# Patient Record
Sex: Male | Born: 1955 | Race: White | Hispanic: No | Marital: Single | Smoking: Current every day smoker
Health system: Southern US, Community
[De-identification: ages and names within clinical notes are randomized; demographics above are authoritative.]

## PROBLEM LIST (undated history)

## (undated) DIAGNOSIS — F419 Anxiety disorder, unspecified: Secondary | ICD-10-CM

## (undated) DIAGNOSIS — E8770 Fluid overload, unspecified: Secondary | ICD-10-CM

## (undated) DIAGNOSIS — I10 Essential (primary) hypertension: Secondary | ICD-10-CM

## (undated) DIAGNOSIS — I770 Arteriovenous fistula, acquired: Secondary | ICD-10-CM

## (undated) DIAGNOSIS — D689 Coagulation defect, unspecified: Secondary | ICD-10-CM

## (undated) DIAGNOSIS — L03114 Cellulitis of left upper limb: Secondary | ICD-10-CM

## (undated) DIAGNOSIS — M4640 Discitis, unspecified, site unspecified: Secondary | ICD-10-CM

## (undated) DIAGNOSIS — L299 Pruritus, unspecified: Secondary | ICD-10-CM

## (undated) DIAGNOSIS — N186 End stage renal disease: Secondary | ICD-10-CM

## (undated) DIAGNOSIS — D509 Iron deficiency anemia, unspecified: Secondary | ICD-10-CM

## (undated) DIAGNOSIS — K602 Anal fissure, unspecified: Secondary | ICD-10-CM

## (undated) DIAGNOSIS — I499 Cardiac arrhythmia, unspecified: Secondary | ICD-10-CM

## (undated) DIAGNOSIS — G4730 Sleep apnea, unspecified: Secondary | ICD-10-CM

## (undated) DIAGNOSIS — K219 Gastro-esophageal reflux disease without esophagitis: Secondary | ICD-10-CM

## (undated) DIAGNOSIS — D631 Anemia in chronic kidney disease: Secondary | ICD-10-CM

## (undated) DIAGNOSIS — I129 Hypertensive chronic kidney disease with stage 1 through stage 4 chronic kidney disease, or unspecified chronic kidney disease: Secondary | ICD-10-CM

## (undated) DIAGNOSIS — Q613 Polycystic kidney, unspecified: Secondary | ICD-10-CM

## (undated) DIAGNOSIS — E875 Hyperkalemia: Secondary | ICD-10-CM

## (undated) DIAGNOSIS — G10 Huntington's disease: Secondary | ICD-10-CM

## (undated) DIAGNOSIS — T82590D Other mechanical complication of surgically created arteriovenous fistula, subsequent encounter: Secondary | ICD-10-CM

## (undated) DIAGNOSIS — E559 Vitamin D deficiency, unspecified: Secondary | ICD-10-CM

## (undated) DIAGNOSIS — E876 Hypokalemia: Secondary | ICD-10-CM

## (undated) DIAGNOSIS — R52 Pain, unspecified: Secondary | ICD-10-CM

## (undated) DIAGNOSIS — F329 Major depressive disorder, single episode, unspecified: Secondary | ICD-10-CM

## (undated) DIAGNOSIS — N2581 Secondary hyperparathyroidism of renal origin: Secondary | ICD-10-CM

## (undated) DIAGNOSIS — I639 Cerebral infarction, unspecified: Secondary | ICD-10-CM

## (undated) HISTORY — PX: AV FISTULA PLACEMENT: SHX1204

## (undated) HISTORY — DX: Huntington's disease: G10

## (undated) HISTORY — PX: COLONOSCOPY: SHX174

## (undated) HISTORY — PX: ANAL FISSURE REPAIR: SHX2312

---
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
## 2018-04-29 DIAGNOSIS — I129 Hypertensive chronic kidney disease with stage 1 through stage 4 chronic kidney disease, or unspecified chronic kidney disease: Secondary | ICD-10-CM | POA: Insufficient documentation

## 2018-04-29 DIAGNOSIS — N189 Chronic kidney disease, unspecified: Secondary | ICD-10-CM | POA: Insufficient documentation

## 2018-04-29 DIAGNOSIS — K602 Anal fissure, unspecified: Secondary | ICD-10-CM | POA: Insufficient documentation

## 2018-04-29 DIAGNOSIS — Z85038 Personal history of other malignant neoplasm of large intestine: Secondary | ICD-10-CM

## 2018-04-29 DIAGNOSIS — K219 Gastro-esophageal reflux disease without esophagitis: Secondary | ICD-10-CM | POA: Insufficient documentation

## 2018-04-29 DIAGNOSIS — R52 Pain, unspecified: Secondary | ICD-10-CM | POA: Insufficient documentation

## 2018-04-29 DIAGNOSIS — Q613 Polycystic kidney, unspecified: Secondary | ICD-10-CM

## 2018-04-29 DIAGNOSIS — M4640 Discitis, unspecified, site unspecified: Secondary | ICD-10-CM | POA: Insufficient documentation

## 2018-04-29 DIAGNOSIS — F419 Anxiety disorder, unspecified: Secondary | ICD-10-CM

## 2018-04-29 DIAGNOSIS — N186 End stage renal disease: Secondary | ICD-10-CM | POA: Insufficient documentation

## 2018-04-29 DIAGNOSIS — E559 Vitamin D deficiency, unspecified: Secondary | ICD-10-CM

## 2018-04-29 DIAGNOSIS — L299 Pruritus, unspecified: Secondary | ICD-10-CM

## 2018-04-29 DIAGNOSIS — F329 Major depressive disorder, single episode, unspecified: Secondary | ICD-10-CM

## 2018-04-29 DIAGNOSIS — D631 Anemia in chronic kidney disease: Secondary | ICD-10-CM | POA: Insufficient documentation

## 2018-04-29 DIAGNOSIS — D689 Coagulation defect, unspecified: Secondary | ICD-10-CM

## 2018-04-29 DIAGNOSIS — Z111 Encounter for screening for respiratory tuberculosis: Secondary | ICD-10-CM

## 2018-04-29 DIAGNOSIS — M179 Osteoarthritis of knee, unspecified: Secondary | ICD-10-CM

## 2018-04-29 DIAGNOSIS — G589 Mononeuropathy, unspecified: Secondary | ICD-10-CM | POA: Insufficient documentation

## 2018-04-29 DIAGNOSIS — I770 Arteriovenous fistula, acquired: Secondary | ICD-10-CM

## 2018-04-29 HISTORY — DX: Coagulation defect, unspecified: D68.9

## 2018-04-29 HISTORY — DX: End stage renal disease: N18.6

## 2018-04-29 HISTORY — DX: Gastro-esophageal reflux disease without esophagitis: K21.9

## 2018-04-29 HISTORY — DX: Mononeuropathy, unspecified: G58.9

## 2018-04-29 HISTORY — DX: Hypertensive chronic kidney disease with stage 1 through stage 4 chronic kidney disease, or unspecified chronic kidney disease: I12.9

## 2018-04-29 HISTORY — DX: Anxiety disorder, unspecified: F41.9

## 2018-04-29 HISTORY — DX: Chronic kidney disease, unspecified: N18.9

## 2018-04-29 HISTORY — DX: Anemia in chronic kidney disease: D63.1

## 2018-04-29 HISTORY — DX: Pruritus, unspecified: L29.9

## 2018-04-29 HISTORY — DX: Personal history of other malignant neoplasm of large intestine: Z85.038

## 2018-04-29 HISTORY — DX: Anal fissure, unspecified: K60.2

## 2018-04-29 HISTORY — DX: Vitamin D deficiency, unspecified: E55.9

## 2018-04-29 HISTORY — DX: Polycystic kidney, unspecified: Q61.3

## 2018-04-29 HISTORY — DX: Pain, unspecified: R52

## 2018-04-29 HISTORY — DX: Encounter for screening for respiratory tuberculosis: Z11.1

## 2018-04-29 HISTORY — DX: Osteoarthritis of knee, unspecified: M17.9

## 2018-04-29 HISTORY — DX: Arteriovenous fistula, acquired: I77.0

## 2018-04-29 HISTORY — DX: Major depressive disorder, single episode, unspecified: F32.9

## 2018-04-29 HISTORY — DX: Discitis, unspecified, site unspecified: M46.40

## 2018-04-30 DIAGNOSIS — E876 Hypokalemia: Secondary | ICD-10-CM | POA: Insufficient documentation

## 2018-04-30 HISTORY — DX: Hypokalemia: E87.6

## 2018-05-27 DIAGNOSIS — D509 Iron deficiency anemia, unspecified: Secondary | ICD-10-CM | POA: Insufficient documentation

## 2018-05-27 DIAGNOSIS — Z23 Encounter for immunization: Secondary | ICD-10-CM | POA: Insufficient documentation

## 2018-05-27 HISTORY — DX: Encounter for immunization: Z23

## 2018-05-27 HISTORY — DX: Iron deficiency anemia, unspecified: D50.9

## 2018-06-04 DIAGNOSIS — N2581 Secondary hyperparathyroidism of renal origin: Secondary | ICD-10-CM

## 2018-06-04 HISTORY — DX: Secondary hyperparathyroidism of renal origin: N25.81

## 2018-06-09 DIAGNOSIS — E875 Hyperkalemia: Secondary | ICD-10-CM | POA: Insufficient documentation

## 2018-06-09 HISTORY — DX: Hyperkalemia: E87.5

## 2018-07-13 DIAGNOSIS — E8770 Fluid overload, unspecified: Secondary | ICD-10-CM | POA: Insufficient documentation

## 2018-07-13 DIAGNOSIS — E8779 Other fluid overload: Secondary | ICD-10-CM

## 2018-07-13 HISTORY — DX: Other fluid overload: E87.79

## 2018-07-13 HISTORY — DX: Fluid overload, unspecified: E87.70

## 2018-08-18 DIAGNOSIS — Z4802 Encounter for removal of sutures: Secondary | ICD-10-CM | POA: Insufficient documentation

## 2018-08-18 HISTORY — DX: Encounter for removal of sutures: Z48.02

## 2018-09-08 DIAGNOSIS — L03116 Cellulitis of left lower limb: Secondary | ICD-10-CM

## 2018-09-08 DIAGNOSIS — L03114 Cellulitis of left upper limb: Secondary | ICD-10-CM | POA: Insufficient documentation

## 2018-09-08 HISTORY — DX: Cellulitis of left lower limb: L03.116

## 2018-09-08 HISTORY — DX: Cellulitis of left upper limb: L03.114

## 2018-11-10 DIAGNOSIS — T82590D Other mechanical complication of surgically created arteriovenous fistula, subsequent encounter: Secondary | ICD-10-CM | POA: Insufficient documentation

## 2018-11-10 HISTORY — DX: Other mechanical complication of surgically created arteriovenous fistula, subsequent encounter: T82.590D

## 2019-03-16 ENCOUNTER — Other Ambulatory Visit: Payer: Self-pay

## 2019-03-16 ENCOUNTER — Encounter: Payer: Self-pay | Admitting: Surgery

## 2019-03-16 DIAGNOSIS — M79642 Pain in left hand: Secondary | ICD-10-CM

## 2019-03-19 ENCOUNTER — Telehealth (HOSPITAL_COMMUNITY): Payer: Self-pay

## 2019-03-19 NOTE — Telephone Encounter (Signed)
The above patient or their representative was contacted and gave the following answers to these questions:         Do you have any of the following symptoms?no  Fever                    Cough                   Shortness of breath  Do  you have any of the following other symptoms?    muscle pain         vomiting,        diarrhea        rash         weakness        red eye        abdominal pain         bruising          bruising or bleeding              joint pain           severe headache    Have you been in contact with someone who was or has been sick in the past 2 weeks?no  Yes                 Unsure                         Unable to assess   Does the person that you were in contact with have any of the following symptoms?   Cough         shortness of breath           muscle pain         vomiting,            diarrhea            rash            weakness           fever            red eye           abdominal pain           bruising  or  bleeding                joint pain                severe headache               Have you  or someone you have been in contact with traveled internationally in th last month?    no     If yes, which countries?   Have you  or someone you have been in contact with traveled outside [place] in th last month?     no    If yes, which state and city?   COMMENTS OR ACTION PLAN FOR THIS PATIENT:          

## 2019-03-22 ENCOUNTER — Ambulatory Visit (HOSPITAL_COMMUNITY)
Admission: RE | Admit: 2019-03-22 | Discharge: 2019-03-22 | Disposition: A | Discharge status: Home / Self Care | Payer: No Typology Code available for payment source | Source: Ambulatory Visit | Attending: Surgery | Admitting: Surgery

## 2019-03-22 ENCOUNTER — Ambulatory Visit (INDEPENDENT_AMBULATORY_CARE_PROVIDER_SITE_OTHER): Payer: No Typology Code available for payment source | Admitting: Surgery

## 2019-03-22 ENCOUNTER — Other Ambulatory Visit: Payer: Self-pay

## 2019-03-22 ENCOUNTER — Encounter: Payer: Self-pay | Admitting: Surgery

## 2019-03-22 ENCOUNTER — Other Ambulatory Visit: Payer: Self-pay | Admitting: *Deleted

## 2019-03-22 ENCOUNTER — Encounter: Payer: Self-pay | Admitting: *Deleted

## 2019-03-22 VITALS — BP 168/99 | HR 68 | Temp 97.1°F | Resp 20 | Ht 70.0 in | Wt 200.0 lb

## 2019-03-22 DIAGNOSIS — M79642 Pain in left hand: Secondary | ICD-10-CM

## 2019-03-22 DIAGNOSIS — Z992 Dependence on renal dialysis: Secondary | ICD-10-CM

## 2019-03-22 DIAGNOSIS — N186 End stage renal disease: Secondary | ICD-10-CM | POA: Diagnosis not present

## 2019-03-22 NOTE — Progress Notes (Signed)
Vascular and Vein Specialist of [facility]  Patient name: Jacob Colon MRN: [id number] DOB: Mar 07, 1956 Sex: male   REQUESTING PROVIDER:    Renal   REASON FOR CONSULT:    Poorly functioning AV fistula  HISTORY OF PRESENT ILLNESS:   Jacob Colon is a 63 y.o. male, who is s/p left radiocephalic fistula in [place] 1 year ago as well as branch ligation by Dr. Augustin Coupe 6 months ago.  He is complaining of his first 3 fingers going numb with dialysis.  They will occasionally go numb at night.  Hanging his hand over the bed helps.  He does not have any open wounds.    He is on HD T,TH, Sat.  His renal failure is secondary to HTN. He is a smoker.  PAST MEDICAL HISTORY    Past Medical History:  Diagnosis Date  . Anal fissure, unspecified 04/29/2018  . Anemia in chronic kidney disease 04/29/2018  . Anxiety disorder 04/29/2018  . Arteriovenous fistula, acquired ([place]) 04/29/2018  . Cellulitis of left upper limb 09/08/2018  . Coagulation defect ([place]) 04/29/2018  . Discitis, unspecified, site unspecified 04/29/2018  . ESRD (end stage renal disease) ([place]) 04/29/2018  . Fluid overload 07/13/2018  . GERD without esophagitis 04/29/2018  . Huntington's disease ([place])   . Hyperkalemia 06/09/2018  . Hypertensive chronic kidney disease with stage 1 through stage 4 chronic kidney disease, or unspecified chronic kidney disease 04/29/2018  . Hypokalemia 04/30/2018  . Iron deficiency anemia 05/27/2018  . Major depressive disorder, single episode, unspecified 04/29/2018  . Other mechanical complication of surgically created arteriovenous fistula, subsequent encounter 11/10/2018  . Pain, unspecified 04/29/2018  . Polycystic kidney, unspecified 04/29/2018  . Pruritus, unspecified 04/29/2018  . Secondary hyperparathyroidism of renal origin ([place]) 06/04/2018  . Vitamin D deficiency 04/29/2018     FAMILY HISTORY   No family history on file.  SOCIAL HISTORY:    Social History   Socioeconomic History  . Marital status: Single    Spouse name: Not on file  . Number of children: Not on file  . Years of education: Not on file  . Highest education level: Not on file  Occupational History  . Not on file  Social Needs  . Financial resource strain: Not on file  . Food insecurity    Worry: Not on file    Inability: Not on file  . Transportation needs    Medical: Not on file    Non-medical: Not on file  Tobacco Use  . Smoking status: Former Research scientist (life sciences)  . Smokeless tobacco: Former Network engineer and Sexual Activity  . Alcohol use: Not Currently  . Drug use: Yes    Types: Marijuana  . Sexual activity: Not on file  Lifestyle  . Physical activity    Days per week: Not on file    Minutes per session: Not on file  . Stress: Not on file  Relationships  . Social Herbalist on phone: Not on file    Gets together: Not on file    Attends religious service: Not on file    Active member of club or organization: Not on file    Attends meetings of clubs or organizations: Not on file    Relationship status: Not on file  . Intimate partner violence    Fear of current or ex partner: Not on file    Emotionally abused: Not on file    Physically abused: Not on file    Forced sexual activity: Not  on file  Other Topics Concern  . Not on file  Social History Narrative  . Not on file    ALLERGIES:    No Known Allergies  CURRENT MEDICATIONS:    Current Outpatient Medications  Medication Sig Dispense Refill  . amLODipine (NORVASC) 10 MG tablet Take 10 mg by mouth daily.    . busPIRone (BUSPAR) 10 MG tablet Take 10 mg by mouth 3 (three) times daily.    . carvedilol (COREG) 3.125 MG tablet Take 3.125 mg by mouth 2 (two) times daily with a meal.    . hydrALAZINE (APRESOLINE) 10 MG tablet Take 10 mg by mouth daily.    . meclizine (ANTIVERT) 25 MG tablet Take 25 mg by mouth 3 (three) times daily as needed for dizziness.    Marland Kitchen omeprazole  (PRILOSEC) 20 MG capsule Take 20 mg by mouth daily.    . Sucroferric Oxyhydroxide (VELPHORO PO) Take 500 mg by mouth 3 (three) times daily.    Marland Kitchen tiZANidine (ZANAFLEX) 4 MG tablet Take 4 mg by mouth every 6 (six) hours as needed for muscle spasms.     No current facility-administered medications for this visit.     REVIEW OF SYSTEMS:   [X]  denotes positive finding, [ ]  denotes negative finding Cardiac  Comments:  Chest pain or chest pressure:    Shortness of breath upon exertion:    Short of breath when lying flat:    Irregular heart rhythm:        Vascular    Pain in calf, thigh, or hip brought on by ambulation:    Pain in feet at night that wakes you up from your sleep:     Blood clot in your veins:    Leg swelling:         Pulmonary    Oxygen at home:    Productive cough:     Wheezing:         Neurologic    Sudden weakness in arms or legs:     Sudden numbness in arms or legs:     Sudden onset of difficulty speaking or slurred speech:    Temporary loss of vision in one eye:     Problems with dizziness:         Gastrointestinal    Blood in stool:      Vomited blood:         Genitourinary    Burning when urinating:     Blood in urine:        Psychiatric    Major depression:         Hematologic    Bleeding problems:    Problems with blood clotting too easily:        Skin    Rashes or ulcers:        Constitutional    Fever or chills:     PHYSICAL EXAM:   There were no vitals filed for this visit.  GENERAL: The patient is a well-nourished male, in no acute distress. The vital signs are documented above. CARDIAC: There is a regular rate and rhythm.  VASCULAR: good thrill in AVF PULMONARY: Nonlabored respirations MUSCULOSKELETAL: There are no major deformities or cyanosis. NEUROLOGIC: No focal weakness or paresthesias are detected. SKIN: There are no ulcers or rashes noted. PSYCHIATRIC: The patient has a normal affect.  STUDIES:   I have reviewed his  duplex with the following findings: 1. Patent RC AVF 2. 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 3. Retrograde distal radial artery. Forward flow with AVF compression. 4. No significant branches noted.  +------------+----------+-------------+----------+--------------+ OUTFLOW VEINPSV (cm/s)Diameter (cm)Depth (cm)   Describe    +------------+----------+-------------+----------+--------------+ AC Fossa       119        0.55        0.36                  +------------+----------+-------------+----------+--------------+ Mid Forearm     97        0.68        0.61                  +------------+----------+-------------+----------+--------------+ Dist Forearm   176        0.88        0.57                  +------------+----------+-------------+----------+--------------+ Wrist          265        0.77        0.37   pseudoaneurysm +------------+----------+-------------+----------+--------------+   ASSESSMENT and PLAN   Steal syndrome: I discussed our options which include fistula ligation versus banding.  I feel that since he has a pseudoaneurysm at his proximal fistula that it is reasonable to go and and repair of the pseudoaneurysm as well as band the proximal fistula to see if this helps his symptoms.  He understands that this may have no benefit, and it could potentially cause his fistula to fail.  We discussed that if his fistula fails that he would need new access in the other arm.  I will also perform a fistulogram at the time of the procedure as he has not had this done in at least 6 months and reportedly he is having difficulty with cannulation which I suspect is secondary to the tortuosity in his fistula, however one extra there is no other etiology.  This will be scheduled for a nondialysis day.   Leia Alf, MD, FACS [facility] of [facility] [phone] Pager [phone]

## 2019-03-22 NOTE — H&P (View-Only) (Signed)
Vascular and Vein Specialist of [facility]  Patient name: Jacob Colon MRN: [id number] DOB: Jul 29, 1956 Sex: male   REQUESTING PROVIDER:    Renal   REASON FOR CONSULT:    Poorly functioning AV fistula  HISTORY OF PRESENT ILLNESS:   Jacob Colon is a 63 y.o. male, who is s/p left radiocephalic fistula in [place] 1 year ago as well as branch ligation by Dr. Augustin Coupe 6 months ago.  He is complaining of his first 3 fingers going numb with dialysis.  They will occasionally go numb at night.  Hanging his hand over the bed helps.  He does not have any open wounds.    He is on HD T,TH, Sat.  His renal failure is secondary to HTN. He is a smoker.  PAST MEDICAL HISTORY    Past Medical History:  Diagnosis Date  . Anal fissure, unspecified 04/29/2018  . Anemia in chronic kidney disease 04/29/2018  . Anxiety disorder 04/29/2018  . Arteriovenous fistula, acquired ([place]) 04/29/2018  . Cellulitis of left upper limb 09/08/2018  . Coagulation defect ([place]) 04/29/2018  . Discitis, unspecified, site unspecified 04/29/2018  . ESRD (end stage renal disease) ([place]) 04/29/2018  . Fluid overload 07/13/2018  . GERD without esophagitis 04/29/2018  . Huntington's disease ([place])   . Hyperkalemia 06/09/2018  . Hypertensive chronic kidney disease with stage 1 through stage 4 chronic kidney disease, or unspecified chronic kidney disease 04/29/2018  . Hypokalemia 04/30/2018  . Iron deficiency anemia 05/27/2018  . Major depressive disorder, single episode, unspecified 04/29/2018  . Other mechanical complication of surgically created arteriovenous fistula, subsequent encounter 11/10/2018  . Pain, unspecified 04/29/2018  . Polycystic kidney, unspecified 04/29/2018  . Pruritus, unspecified 04/29/2018  . Secondary hyperparathyroidism of renal origin ([place]) 06/04/2018  . Vitamin D deficiency 04/29/2018     FAMILY HISTORY   No family history on file.  SOCIAL HISTORY:    Social History   Socioeconomic History  . Marital status: Single    Spouse name: Not on file  . Number of children: Not on file  . Years of education: Not on file  . Highest education level: Not on file  Occupational History  . Not on file  Social Needs  . Financial resource strain: Not on file  . Food insecurity    Worry: Not on file    Inability: Not on file  . Transportation needs    Medical: Not on file    Non-medical: Not on file  Tobacco Use  . Smoking status: Former Research scientist (life sciences)  . Smokeless tobacco: Former Network engineer and Sexual Activity  . Alcohol use: Not Currently  . Drug use: Yes    Types: Marijuana  . Sexual activity: Not on file  Lifestyle  . Physical activity    Days per week: Not on file    Minutes per session: Not on file  . Stress: Not on file  Relationships  . Social Herbalist on phone: Not on file    Gets together: Not on file    Attends religious service: Not on file    Active member of club or organization: Not on file    Attends meetings of clubs or organizations: Not on file    Relationship status: Not on file  . Intimate partner violence    Fear of current or ex partner: Not on file    Emotionally abused: Not on file    Physically abused: Not on file    Forced sexual activity: Not  on file  Other Topics Concern  . Not on file  Social History Narrative  . Not on file    ALLERGIES:    No Known Allergies  CURRENT MEDICATIONS:    Current Outpatient Medications  Medication Sig Dispense Refill  . amLODipine (NORVASC) 10 MG tablet Take 10 mg by mouth daily.    . busPIRone (BUSPAR) 10 MG tablet Take 10 mg by mouth 3 (three) times daily.    . carvedilol (COREG) 3.125 MG tablet Take 3.125 mg by mouth 2 (two) times daily with a meal.    . hydrALAZINE (APRESOLINE) 10 MG tablet Take 10 mg by mouth daily.    . meclizine (ANTIVERT) 25 MG tablet Take 25 mg by mouth 3 (three) times daily as needed for dizziness.    Marland Kitchen omeprazole  (PRILOSEC) 20 MG capsule Take 20 mg by mouth daily.    . Sucroferric Oxyhydroxide (VELPHORO PO) Take 500 mg by mouth 3 (three) times daily.    Marland Kitchen tiZANidine (ZANAFLEX) 4 MG tablet Take 4 mg by mouth every 6 (six) hours as needed for muscle spasms.     No current facility-administered medications for this visit.     REVIEW OF SYSTEMS:   [X]  denotes positive finding, [ ]  denotes negative finding Cardiac  Comments:  Chest pain or chest pressure:    Shortness of breath upon exertion:    Short of breath when lying flat:    Irregular heart rhythm:        Vascular    Pain in calf, thigh, or hip brought on by ambulation:    Pain in feet at night that wakes you up from your sleep:     Blood clot in your veins:    Leg swelling:         Pulmonary    Oxygen at home:    Productive cough:     Wheezing:         Neurologic    Sudden weakness in arms or legs:     Sudden numbness in arms or legs:     Sudden onset of difficulty speaking or slurred speech:    Temporary loss of vision in one eye:     Problems with dizziness:         Gastrointestinal    Blood in stool:      Vomited blood:         Genitourinary    Burning when urinating:     Blood in urine:        Psychiatric    Major depression:         Hematologic    Bleeding problems:    Problems with blood clotting too easily:        Skin    Rashes or ulcers:        Constitutional    Fever or chills:     PHYSICAL EXAM:   There were no vitals filed for this visit.  GENERAL: The patient is a well-nourished male, in no acute distress. The vital signs are documented above. CARDIAC: There is a regular rate and rhythm.  VASCULAR: good thrill in AVF PULMONARY: Nonlabored respirations MUSCULOSKELETAL: There are no major deformities or cyanosis. NEUROLOGIC: No focal weakness or paresthesias are detected. SKIN: There are no ulcers or rashes noted. PSYCHIATRIC: The patient has a normal affect.  STUDIES:   I have reviewed his  duplex with the following findings: 1. Patent RC AVF 2. 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 3. Retrograde distal radial artery. Forward flow with AVF compression. 4. No significant branches noted.  +------------+----------+-------------+----------+--------------+ OUTFLOW VEINPSV (cm/s)Diameter (cm)Depth (cm)   Describe    +------------+----------+-------------+----------+--------------+ AC Fossa       119        0.55        0.36                  +------------+----------+-------------+----------+--------------+ Mid Forearm     97        0.68        0.61                  +------------+----------+-------------+----------+--------------+ Dist Forearm   176        0.88        0.57                  +------------+----------+-------------+----------+--------------+ Wrist          265        0.77        0.37   pseudoaneurysm +------------+----------+-------------+----------+--------------+   ASSESSMENT and PLAN   Steal syndrome: I discussed our options which include fistula ligation versus banding.  I feel that since he has a pseudoaneurysm at his proximal fistula that it is reasonable to go and and repair of the pseudoaneurysm as well as band the proximal fistula to see if this helps his symptoms.  He understands that this may have no benefit, and it could potentially cause his fistula to fail.  We discussed that if his fistula fails that he would need new access in the other arm.  I will also perform a fistulogram at the time of the procedure as he has not had this done in at least 6 months and reportedly he is having difficulty with cannulation which I suspect is secondary to the tortuosity in his fistula, however one extra there is no other etiology.  This will be scheduled for a nondialysis day.   Leia Alf, MD, FACS [facility] Specialists of [facility] [phone] Pager [phone]

## 2019-03-29 ENCOUNTER — Telehealth: Payer: Self-pay | Admitting: *Deleted

## 2019-03-29 NOTE — Telephone Encounter (Signed)
Spoke with Jacob Colon at [facility]. Will instruct patient of time change for 04/07/2019 procedure. Arrival time at [facility] admitting is 10 am.

## 2019-03-29 NOTE — Telephone Encounter (Signed)
No new notes for this encounter.

## 2019-04-05 ENCOUNTER — Other Ambulatory Visit (HOSPITAL_COMMUNITY)
Admission: RE | Admit: 2019-04-05 | Discharge: 2019-04-05 | Disposition: A | Discharge status: Home / Self Care | Payer: No Typology Code available for payment source | Source: Ambulatory Visit | Attending: Surgery | Admitting: Surgery

## 2019-04-05 DIAGNOSIS — Z1159 Encounter for screening for other viral diseases: Secondary | ICD-10-CM | POA: Diagnosis not present

## 2019-04-05 DIAGNOSIS — Z01812 Encounter for preprocedural laboratory examination: Secondary | ICD-10-CM | POA: Insufficient documentation

## 2019-04-06 ENCOUNTER — Encounter (HOSPITAL_COMMUNITY): Payer: Self-pay | Admitting: *Deleted

## 2019-04-06 LAB — SARS CORONAVIRUS 2 (TAT 6-24 HRS): SARS Coronavirus 2: NEGATIVE

## 2019-04-06 NOTE — Progress Notes (Signed)
Spoke with pt for pre-op call. Pt states he has an history of an irregular heartbeat. Denies any other heart problems. Pt goes to the [place] in [place]. States he had a stress test done about 2 years ago there. Will request records from [place]. Pt states he is not diabetic.  Pt have Covid 19 test done yesterday and it was negative. He states he went straight home after the test, is at dialysis now and will go straight home after dialysis.    Coronavirus Screening  Have you experienced the following symptoms:  Cough NO Fever (>100.32F)  NO Runny nose NO Sore throat NO Difficulty breathing/shortness of breath NO  Have you or a family member traveled in the last 14 days and where? NO  Patient reminded that hospital visitation restrictions are in effect and the importance of the restrictions.

## 2019-04-07 ENCOUNTER — Ambulatory Visit (HOSPITAL_COMMUNITY): Payer: No Typology Code available for payment source | Admitting: Anesthesiology

## 2019-04-07 ENCOUNTER — Other Ambulatory Visit: Payer: Self-pay

## 2019-04-07 ENCOUNTER — Ambulatory Visit (HOSPITAL_COMMUNITY)
Admission: RE | Admit: 2019-04-07 | Discharge: 2019-04-07 | Disposition: A | Discharge status: Home / Self Care | Payer: No Typology Code available for payment source | Attending: Surgery | Admitting: Surgery

## 2019-04-07 ENCOUNTER — Ambulatory Visit (HOSPITAL_COMMUNITY): Payer: No Typology Code available for payment source

## 2019-04-07 ENCOUNTER — Encounter (HOSPITAL_COMMUNITY)
Admission: RE | Disposition: A | Discharge status: Home / Self Care | Payer: Self-pay | Source: Home / Self Care | Attending: Surgery

## 2019-04-07 ENCOUNTER — Encounter (HOSPITAL_COMMUNITY): Payer: Self-pay

## 2019-04-07 DIAGNOSIS — Z79899 Other long term (current) drug therapy: Secondary | ICD-10-CM | POA: Insufficient documentation

## 2019-04-07 DIAGNOSIS — K219 Gastro-esophageal reflux disease without esophagitis: Secondary | ICD-10-CM | POA: Diagnosis not present

## 2019-04-07 DIAGNOSIS — T82898A Other specified complication of vascular prosthetic devices, implants and grafts, initial encounter: Secondary | ICD-10-CM | POA: Insufficient documentation

## 2019-04-07 DIAGNOSIS — G10 Huntington's disease: Secondary | ICD-10-CM | POA: Insufficient documentation

## 2019-04-07 DIAGNOSIS — Z87891 Personal history of nicotine dependence: Secondary | ICD-10-CM | POA: Insufficient documentation

## 2019-04-07 DIAGNOSIS — N2581 Secondary hyperparathyroidism of renal origin: Secondary | ICD-10-CM | POA: Diagnosis not present

## 2019-04-07 DIAGNOSIS — G4730 Sleep apnea, unspecified: Secondary | ICD-10-CM | POA: Insufficient documentation

## 2019-04-07 DIAGNOSIS — I120 Hypertensive chronic kidney disease with stage 5 chronic kidney disease or end stage renal disease: Secondary | ICD-10-CM | POA: Insufficient documentation

## 2019-04-07 DIAGNOSIS — N186 End stage renal disease: Secondary | ICD-10-CM

## 2019-04-07 DIAGNOSIS — I6930 Unspecified sequelae of cerebral infarction: Secondary | ICD-10-CM | POA: Insufficient documentation

## 2019-04-07 DIAGNOSIS — Y832 Surgical operation with anastomosis, bypass or graft as the cause of abnormal reaction of the patient, or of later complication, without mention of misadventure at the time of the procedure: Secondary | ICD-10-CM | POA: Insufficient documentation

## 2019-04-07 DIAGNOSIS — F419 Anxiety disorder, unspecified: Secondary | ICD-10-CM | POA: Insufficient documentation

## 2019-04-07 DIAGNOSIS — M1990 Unspecified osteoarthritis, unspecified site: Secondary | ICD-10-CM | POA: Diagnosis not present

## 2019-04-07 DIAGNOSIS — Z992 Dependence on renal dialysis: Secondary | ICD-10-CM | POA: Insufficient documentation

## 2019-04-07 DIAGNOSIS — Z419 Encounter for procedure for purposes other than remedying health state, unspecified: Secondary | ICD-10-CM

## 2019-04-07 HISTORY — PX: FISTULOGRAM: SHX5832

## 2019-04-07 HISTORY — PX: REVISION OF ARTERIOVENOUS GORETEX GRAFT: SHX6073

## 2019-04-07 HISTORY — DX: Cardiac arrhythmia, unspecified: I49.9

## 2019-04-07 HISTORY — DX: Sleep apnea, unspecified: G47.30

## 2019-04-07 HISTORY — DX: Cerebral infarction, unspecified: I63.9

## 2019-04-07 HISTORY — DX: Essential (primary) hypertension: I10

## 2019-04-07 LAB — POCT I-STAT 4, (NA,K, GLUC, HGB,HCT)
Glucose, Bld: 81 mg/dL (ref 70–99)
HCT: 32 % — ABNORMAL LOW (ref 39.0–52.0)
Hemoglobin: 10.9 g/dL — ABNORMAL LOW (ref 13.0–17.0)
Potassium: 5.7 mmol/L — ABNORMAL HIGH (ref 3.5–5.1)
Sodium: 133 mmol/L — ABNORMAL LOW (ref 135–145)

## 2019-04-07 LAB — GLUCOSE, CAPILLARY: Glucose-Capillary: 80 mg/dL (ref 70–99)

## 2019-04-07 SURGERY — REVISION OF ARTERIOVENOUS GORETEX GRAFT
Anesthesia: Monitor Anesthesia Care | Site: Arm Lower | Laterality: Left

## 2019-04-07 MED ORDER — CEFAZOLIN SODIUM-DEXTROSE 2-4 GM/100ML-% IV SOLN
2.0000 g | INTRAVENOUS | Status: AC
  Administered 2019-04-07: 14:00:00 2 g via INTRAVENOUS
  Filled 2019-04-07: qty 100

## 2019-04-07 MED ORDER — SODIUM CHLORIDE 0.9 % IV SOLN
INTRAVENOUS | Status: DC
  Administered 2019-04-07: 12:00:00 via INTRAVENOUS

## 2019-04-07 MED ORDER — 0.9 % SODIUM CHLORIDE (POUR BTL) OPTIME
TOPICAL | Status: DC | PRN
  Administered 2019-04-07: 15:00:00 1000 mL

## 2019-04-07 MED ORDER — PROPOFOL 10 MG/ML IV BOLUS
INTRAVENOUS | Status: AC
  Filled 2019-04-07: qty 20

## 2019-04-07 MED ORDER — OXYCODONE-ACETAMINOPHEN 5-325 MG PO TABS: 1.0000 | ORAL_TABLET | Freq: Four times a day (QID) | ORAL | 0 refills | Status: DC | PRN

## 2019-04-07 MED ORDER — SODIUM CHLORIDE 0.9 % IV SOLN
INTRAVENOUS | Status: AC
  Filled 2019-04-07: qty 1.2

## 2019-04-07 MED ORDER — HYDRALAZINE HCL 20 MG/ML IJ SOLN
INTRAMUSCULAR | Status: AC
  Filled 2019-04-07: qty 1

## 2019-04-07 MED ORDER — ONDANSETRON HCL 4 MG/2ML IJ SOLN
INTRAMUSCULAR | Status: AC
  Filled 2019-04-07: qty 2

## 2019-04-07 MED ORDER — PROPOFOL 10 MG/ML IV BOLUS
INTRAVENOUS | Status: DC | PRN
  Administered 2019-04-07 (×2): 50 mg via INTRAVENOUS
  Administered 2019-04-07 (×2): 30 mg via INTRAVENOUS

## 2019-04-07 MED ORDER — LIDOCAINE-EPINEPHRINE (PF) 1 %-1:200000 IJ SOLN
INTRAMUSCULAR | Status: DC | PRN
  Administered 2019-04-07: 8 mL

## 2019-04-07 MED ORDER — FENTANYL CITRATE (PF) 250 MCG/5ML IJ SOLN
INTRAMUSCULAR | Status: AC
  Filled 2019-04-07: qty 5

## 2019-04-07 MED ORDER — ONDANSETRON HCL 4 MG/2ML IJ SOLN
INTRAMUSCULAR | Status: AC
  Administered 2019-04-07: 18:00:00 4 mg via INTRAVENOUS
  Filled 2019-04-07: qty 2

## 2019-04-07 MED ORDER — ESMOLOL HCL 100 MG/10ML IV SOLN
INTRAVENOUS | Status: AC
  Filled 2019-04-07: qty 10

## 2019-04-07 MED ORDER — ESMOLOL HCL 100 MG/10ML IV SOLN
INTRAVENOUS | Status: DC | PRN
  Administered 2019-04-07: 20 mg via INTRAVENOUS
  Administered 2019-04-07: 10 mg via INTRAVENOUS

## 2019-04-07 MED ORDER — ONDANSETRON HCL 4 MG/2ML IJ SOLN
4.0000 mg | Freq: Once | INTRAMUSCULAR | Status: AC
  Administered 2019-04-07: 18:00:00 4 mg via INTRAVENOUS

## 2019-04-07 MED ORDER — PHENYLEPHRINE 40 MCG/ML (10ML) SYRINGE FOR IV PUSH (FOR BLOOD PRESSURE SUPPORT)
PREFILLED_SYRINGE | INTRAVENOUS | Status: DC | PRN
  Administered 2019-04-07 (×2): 80 ug via INTRAVENOUS

## 2019-04-07 MED ORDER — MIDAZOLAM HCL 2 MG/2ML IJ SOLN
INTRAMUSCULAR | Status: AC
  Filled 2019-04-07: qty 2

## 2019-04-07 MED ORDER — FENTANYL CITRATE (PF) 100 MCG/2ML IJ SOLN
INTRAMUSCULAR | Status: DC | PRN
  Administered 2019-04-07 (×6): 50 ug via INTRAVENOUS
  Administered 2019-04-07: 100 ug via INTRAVENOUS
  Administered 2019-04-07: 50 ug via INTRAVENOUS

## 2019-04-07 MED ORDER — IODIXANOL 320 MG/ML IV SOLN
INTRAVENOUS | Status: DC | PRN
  Administered 2019-04-07: 15:00:00 20 mL via INTRAVENOUS

## 2019-04-07 MED ORDER — CHLORHEXIDINE GLUCONATE 4 % EX LIQD: 60.0000 mL | Freq: Once | CUTANEOUS | Status: DC

## 2019-04-07 MED ORDER — SODIUM CHLORIDE 0.9 % IV SOLN
INTRAVENOUS | Status: DC | PRN
  Administered 2019-04-07: 500 mL

## 2019-04-07 MED ORDER — ONDANSETRON HCL 4 MG/2ML IJ SOLN
INTRAMUSCULAR | Status: DC | PRN
  Administered 2019-04-07: 4 mg via INTRAVENOUS

## 2019-04-07 MED ORDER — DEXAMETHASONE SODIUM PHOSPHATE 10 MG/ML IJ SOLN
INTRAMUSCULAR | Status: DC | PRN
  Administered 2019-04-07: 5 mg via INTRAVENOUS

## 2019-04-07 MED ORDER — DEXAMETHASONE SODIUM PHOSPHATE 10 MG/ML IJ SOLN
INTRAMUSCULAR | Status: AC
  Filled 2019-04-07: qty 5

## 2019-04-07 MED ORDER — LIDOCAINE 2% (20 MG/ML) 5 ML SYRINGE
INTRAMUSCULAR | Status: DC | PRN
  Administered 2019-04-07: 100 mg via INTRAVENOUS

## 2019-04-07 MED ORDER — HYDRALAZINE HCL 20 MG/ML IJ SOLN
10.0000 mg | INTRAMUSCULAR | Status: DC | PRN
  Administered 2019-04-07: 17:00:00 10 mg via INTRAVENOUS

## 2019-04-07 MED ORDER — LIDOCAINE-EPINEPHRINE (PF) 1 %-1:200000 IJ SOLN
INTRAMUSCULAR | Status: AC
  Filled 2019-04-07: qty 30

## 2019-04-07 SURGICAL SUPPLY — 56 items
BAG BANDED W/RUBBER/TAPE 36X54 (MISCELLANEOUS) ×3 IMPLANT
BNDG ESMARK 4X9 LF (GAUZE/BANDAGES/DRESSINGS) ×2 IMPLANT
CANISTER SUCT 3000ML PPV (MISCELLANEOUS) ×3 IMPLANT
CHLORAPREP W/TINT 10.5 ML (MISCELLANEOUS) ×3 IMPLANT
CLIP VESOCCLUDE MED 6/CT (CLIP) ×3 IMPLANT
CLIP VESOCCLUDE SM WIDE 6/CT (CLIP) ×3 IMPLANT
COVER DOME SNAP 22 D (MISCELLANEOUS) ×1 IMPLANT
COVER WAND RF STERILE (DRAPES) ×1 IMPLANT
CUFF TOURN SGL QUICK 24 (TOURNIQUET CUFF) ×2
CUFF TRNQT CYL 24X4X16.5-23 (TOURNIQUET CUFF) IMPLANT
DERMABOND ADVANCED (GAUZE/BANDAGES/DRESSINGS) ×2
DERMABOND ADVANCED .7 DNX12 (GAUZE/BANDAGES/DRESSINGS) ×1 IMPLANT
DRAPE BRACHIAL (DRAPES) ×1 IMPLANT
DRSG TEGADERM 4X4.75 (GAUZE/BANDAGES/DRESSINGS) ×3 IMPLANT
ELECT REM PT RETURN 9FT ADLT (ELECTROSURGICAL) ×3
ELECTRODE REM PT RTRN 9FT ADLT (ELECTROSURGICAL) ×1 IMPLANT
GEL ULTRASOUND 8.5O AQUASONIC (MISCELLANEOUS) ×3 IMPLANT
GLOVE BIOGEL PI IND STRL 7.5 (GLOVE) ×1 IMPLANT
GLOVE BIOGEL PI IND STRL 8 (GLOVE) ×1 IMPLANT
GLOVE BIOGEL PI INDICATOR 7.5 (GLOVE) ×2
GLOVE BIOGEL PI INDICATOR 8 (GLOVE) ×4
GLOVE SURG SS PI 7.5 STRL IVOR (GLOVE) ×5 IMPLANT
GOWN STRL REUS W/ TWL LRG LVL3 (GOWN DISPOSABLE) ×3 IMPLANT
GOWN STRL REUS W/ TWL XL LVL3 (GOWN DISPOSABLE) ×1 IMPLANT
GOWN STRL REUS W/TWL LRG LVL3 (GOWN DISPOSABLE) ×6
GOWN STRL REUS W/TWL XL LVL3 (GOWN DISPOSABLE) ×2
GUIDEWIRE BENTSON (WIRE) ×2 IMPLANT
HEMOSTAT SNOW SURGICEL 2X4 (HEMOSTASIS) IMPLANT
KIT BASIN OR (CUSTOM PROCEDURE TRAY) ×3 IMPLANT
KIT ENCORE 26 ADVANTAGE (KITS) IMPLANT
KIT TURNOVER KIT B (KITS) ×3 IMPLANT
NDL HYPO 25GX1X1/2 BEV (NEEDLE) ×1 IMPLANT
NDL PERC 18GX7CM (NEEDLE) IMPLANT
NEEDLE HYPO 25GX1X1/2 BEV (NEEDLE) ×3 IMPLANT
NEEDLE PERC 18GX7CM (NEEDLE) IMPLANT
NS IRRIG 1000ML POUR BTL (IV SOLUTION) ×3 IMPLANT
PACK CV ACCESS (CUSTOM PROCEDURE TRAY) ×3 IMPLANT
PACK ENDO MINOR (CUSTOM PROCEDURE TRAY) IMPLANT
PAD ARMBOARD 7.5X6 YLW CONV (MISCELLANEOUS) ×6 IMPLANT
PAD CAST 4YDX4 CTTN HI CHSV (CAST SUPPLIES) IMPLANT
PADDING CAST COTTON 4X4 STRL (CAST SUPPLIES) ×2
SET MICROPUNCTURE 5F STIFF (MISCELLANEOUS) ×3 IMPLANT
STOPCOCK 4 WAY LG BORE MALE ST (IV SETS) ×2 IMPLANT
STOPCOCK MORSE 400PSI 3WAY (MISCELLANEOUS) ×1 IMPLANT
SUT PROLENE 5 0 C 1 24 (SUTURE) ×4 IMPLANT
SUT PROLENE 6 0 BV (SUTURE) ×10 IMPLANT
SUT VIC AB 3-0 SH 27 (SUTURE) ×2
SUT VIC AB 3-0 SH 27X BRD (SUTURE) ×2 IMPLANT
SUT VICRYL 4-0 PS2 18IN ABS (SUTURE) ×2 IMPLANT
SYR 10ML LL (SYRINGE) ×9 IMPLANT
SYR 20CC LL (SYRINGE) ×6 IMPLANT
SYR CONTROL 10ML LL (SYRINGE) ×3 IMPLANT
TOWEL GREEN STERILE (TOWEL DISPOSABLE) ×3 IMPLANT
TUBING CIL FLEX 10 FLL-RA (TUBING) ×3 IMPLANT
UNDERPAD 30X30 (UNDERPADS AND DIAPERS) ×3 IMPLANT
WATER STERILE IRR 1000ML POUR (IV SOLUTION) ×3 IMPLANT

## 2019-04-07 NOTE — Transfer of Care (Signed)
Immediate Anesthesia Transfer of Care Note  Patient: Jacob Colon  Procedure(s) Performed: BANDING AND REPAIR OF PSEUDOANEURYSM ARTERIOVENOUS FISTULA LEFT ARM (Left Arm Lower) FISTULOGRAM  WITH INTERVENTION OF ARTERIOVENOUS FISTULA LEFT ARM (Left Arm Lower)  Patient Location: PACU  Anesthesia Type:General  Level of Consciousness: drowsy  Airway & Oxygen Therapy: Patient Spontanous Breathing and Patient connected to face mask oxygen  Post-op Assessment: Report given to RN, Post -op Vital signs reviewed and stable and Patient moving all extremities  Post vital signs: Reviewed and stable  Last Vitals:  Vitals Value Taken Time  BP 163/101 04/07/19 1553  Temp 36.2 C 04/07/19 1553  Pulse 73 04/07/19 1602  Resp 13 04/07/19 1602  SpO2 100 % 04/07/19 1602  Vitals shown include unvalidated device data.  Last Pain:  Vitals:   04/07/19 1553  TempSrc:   PainSc: (P) Asleep         Complications: No apparent anesthesia complications

## 2019-04-07 NOTE — Anesthesia Postprocedure Evaluation (Signed)
Anesthesia Post Note  Patient: Jacob Colon  Procedure(s) Performed: BANDING AND REPAIR OF PSEUDOANEURYSM ARTERIOVENOUS FISTULA LEFT ARM (Left Arm Lower) FISTULOGRAM  WITH INTERVENTION OF ARTERIOVENOUS FISTULA LEFT ARM (Left Arm Lower)     Patient location during evaluation: PACU Anesthesia Type: MAC Level of consciousness: awake and alert Pain management: pain level controlled Vital Signs Assessment: post-procedure vital signs reviewed and stable Respiratory status: spontaneous breathing, nonlabored ventilation, respiratory function stable and patient connected to nasal cannula oxygen Cardiovascular status: blood pressure returned to baseline Postop Assessment: no apparent nausea or vomiting Anesthetic complications: no    Last Vitals:  Vitals:   04/07/19 1651 04/07/19 1659  BP: (!) 181/94 (!) 169/102  Pulse: 73 74  Resp: 18 18  Temp:    SpO2: 96% 100%    Last Pain:  Vitals:   04/07/19 1635  TempSrc:   PainSc: 0-No pain                 Audry Pili

## 2019-04-07 NOTE — Interval H&P Note (Signed)
History and Physical Interval Note:  04/07/2019 11:05 AM  Jacob Colon  has presented today for surgery, with the diagnosis of ANEURYSM OF ARTERIOVENOUS FISTULA LEFT ARM.  The various methods of treatment have been discussed with the patient and family. After consideration of risks, benefits and other options for treatment, the patient has consented to  Procedure(s): BANDING AND REPAIR OF ANEURYSM ARTERIOVENOUS FISTULA LEFT ARM (Left) FISTULOGRAM POSSIBLE INTERVENTION ARTERIOVENOUS FISTUL LEFT ARM (Left) as a surgical intervention.  [name] history has been reviewed, patient examined, no change in status, stable for surgery.  I have reviewed the patient's chart and labs.  Questions were answered to the patient's satisfaction.     Annamarie Major

## 2019-04-07 NOTE — Discharge Instructions (Signed)
[facility]  Discharge Instructions  AV Fistula or Graft Surgery for Dialysis Access  Please refer to the following instructions for your post-procedure care. Your surgeon or physician assistant will discuss any changes with you.  Activity  You may drive the day following your surgery, if you are comfortable and no longer taking prescription pain medication. Resume full activity as the soreness in your incision resolves.  Bathing/Showering  You may shower after you go home. Keep your incision dry for 48 hours. Do not soak in a bathtub, hot tub, or swim until the incision heals completely. You may not shower if you have a hemodialysis catheter.  Incision Care  Clean your incision with mild soap and water after 48 hours. Pat the area dry with a clean towel. You do not need a bandage unless otherwise instructed. Do not apply any ointments or creams to your incision. You may have skin glue on your incision. Do not peel it off. It will come off on its own in about one week. Your arm may swell a bit after surgery. To reduce swelling use pillows to elevate your arm so it is above your heart. Your doctor will tell you if you need to lightly wrap your arm with an ACE bandage.  Diet  Resume your normal diet. There are not special food restrictions following this procedure. In order to heal from your surgery, it is CRITICAL to get adequate nutrition. Your body requires vitamins, minerals, and protein. Vegetables are the best source of vitamins and minerals. Vegetables also provide the perfect balance of protein. Processed food has little nutritional value, so try to avoid this.  Medications  Resume taking all of your medications. If your incision is causing pain, you may take over-the counter pain relievers such as acetaminophen (Tylenol). If you were prescribed a stronger pain medication, please be aware these medications can cause nausea and constipation. Prevent  nausea by taking the medication with a snack or meal. Avoid constipation by drinking plenty of fluids and eating foods with high amount of fiber, such as fruits, vegetables, and grains.  Do not take Tylenol if you are taking prescription pain medications.  Follow up Your surgeon may want to see you in the office following your access surgery. If so, this will be arranged at the time of your surgery.  Please call us immediately for any of the following conditions:  Increased pain, redness, drainage (pus) from your incision site Fever of 101 degrees or higher Severe or worsening pain at your incision site Hand pain or numbness.  Reduce your risk of vascular disease:  Stop smoking. If you would like help, call [facility] at 1-800-QUIT-NOW (519) 627-6292) or [place] [facility] [place] your cholesterol Maintain a desired weight Control your diabetes Keep your blood pressure down  Dialysis  It will take several weeks to several months for your new dialysis access to be ready for use. Your surgeon will determine when it is okay to use it. Your nephrologist will continue to direct your dialysis. You can continue to use your Permcath until your new access is ready for use.   04/07/2019 Jacob Colon [id number] [birth year]  Surgeon(s): Serafina Mitchell, MD  Procedure(s): BANDING AND REPAIR OF PSEUDOANEURYSM ARTERIOVENOUS FISTULA LEFT ARM FISTULOGRAM  WITH INTERVENTION OF ARTERIOVENOUS FISTULA LEFT ARM   x May stick graft on designated area only:  Do NOT stick over incision for 8 weeks. May stick above incision See diagram.   If  you have any questions, please call the office at [phone].

## 2019-04-07 NOTE — Progress Notes (Signed)
   04/07/19 1145  OBSTRUCTIVE SLEEP APNEA  Have you ever been diagnosed with sleep apnea through a sleep study? No  Do you snore loudly (loud enough to be heard through closed doors)?  0  Do you often feel tired, fatigued, or sleepy during the daytime (such as falling asleep during driving or talking to someone)? 0  Has anyone observed you stop breathing during your sleep? 1  Do you have, or are you being treated for high blood pressure? 1  BMI more than 35 kg/m2? 0  Age > 50 (1-yes) 1  Neck circumference greater than:Male 16 inches or larger, Male 17inches or larger? 1  Male Gender (Yes=1) 1  Obstructive Sleep Apnea Score 5  Score 5 or greater  Results sent to PCP

## 2019-04-07 NOTE — Anesthesia Procedure Notes (Signed)
Procedure Name: LMA Insertion Date/Time: 04/07/2019 2:00 PM Performed by: Adeola Dennen T, CRNA Pre-anesthesia Checklist: Patient identified, Emergency Drugs available, Suction available and Patient being monitored Patient Re-evaluated:Patient Re-evaluated prior to induction Oxygen Delivery Method: Circle system utilized Preoxygenation: Pre-oxygenation with 100% oxygen Induction Type: IV induction LMA: LMA inserted LMA Size: 5.0 Number of attempts: 1 Airway Equipment and Method: Patient positioned with wedge pillow Placement Confirmation: positive ETCO2 and breath sounds checked- equal and bilateral Tube secured with: Tape Dental Injury: Teeth and Oropharynx as per pre-operative assessment

## 2019-04-07 NOTE — Progress Notes (Signed)
Notified Dr. Fransisco Beau that pt stated he drank 8oz of coffee with cream this morning around 6am. Per Dr. Fransisco Beau pt should be ok to proceed as scheduled because of 6 hour time frame.   Jacqlyn Larsen, RN

## 2019-04-07 NOTE — Anesthesia Preprocedure Evaluation (Addendum)
Anesthesia Evaluation  Patient identified by MRN, date of birth, ID band Patient awake    Reviewed: Allergy & Precautions, NPO status , Patient's Chart, lab work & pertinent test results  History of Anesthesia Complications Negative for: history of anesthetic complications  Airway Mallampati: II  TM Distance: >3 FB Neck ROM: Full    Dental  (+) Dental Advisory Given, Missing   Pulmonary sleep apnea , former smoker,    breath sounds clear to auscultation       Cardiovascular hypertension, Pt. on medications and Pt. on home beta blockers + dysrhythmias  Rhythm:Regular Rate:Normal     Neuro/Psych PSYCHIATRIC DISORDERS Anxiety Depression  Huntington's disease  CVA, Residual Symptoms    GI/Hepatic Neg liver ROS, GERD  Medicated and Controlled,  Endo/Other  negative endocrine ROS  Renal/GU Dialysis and ESRFRenal disease     Musculoskeletal  (+) Arthritis ,   Abdominal   Peds  Hematology  (+) anemia ,   Anesthesia Other Findings   Reproductive/Obstetrics                           Anesthesia Physical Anesthesia Plan  ASA: III  Anesthesia Plan: General   Post-op Pain Management:    Induction: Intravenous  PONV Risk Score and Plan: 1 and Propofol infusion and Treatment may vary due to age or medical condition  Airway Management Planned: LMA  Additional Equipment: None  Intra-op Plan:   Post-operative Plan: Extubation in OR  Informed Consent: I have reviewed the patients History and Physical, chart, labs and discussed the procedure including the risks, benefits and alternatives for the proposed anesthesia with the patient or authorized representative who has indicated his/her understanding and acceptance.     Dental advisory given  Plan Discussed with: CRNA and Anesthesiologist  Anesthesia Plan Comments:       Anesthesia Quick Evaluation

## 2019-04-08 ENCOUNTER — Encounter (HOSPITAL_COMMUNITY): Payer: Self-pay | Admitting: Surgery

## 2019-04-08 NOTE — Op Note (Addendum)
    Patient name: Mihcael Ledee MRN: [id number] DOB: 03/05/1956 Sex: male  04/07/2019 Pre-operative Diagnosis: steal syndrome Post-operative diagnosis:  Same Surgeon:  Annamarie Major Assistants:  Leontine Locket Procedure:   #1: Left arm fistulogram   #2: Repair of left radiocephalic vein fistula pseudoaneurysm Anesthesia: General Blood Loss: Minimal Specimens: None  Findings: The aneurysmal area was just beyond the arterial venous anastomosis.  The vein had basically disrupted.  In order to repair it I resected the dilated area and performed a end to end anastomosis.  Fistulogram showed no stenosis.  The patient had a brisk palmar arch Doppler signal afterwards  Indications: The patient had a fistula created several years ago at a different institution.  Over the past several weeks he has developed numbness in his first 3 fingers.  This is concerning for steal syndrome.  In addition he has been having prolonged bleeding after dialysis.  He comes in today for revision of his fistula, fistulogram.  He understands that this may not address his numbness and he could potentially have to have the fistula ligated  Procedure:  The patient was identified in the holding area and taken to [place] 10  The patient was then placed supine on the table. general anesthesia was administered.  The patient was prepped and draped in the usual sterile fashion.  A time out was called and antibiotics were administered.  [name] previous longitudinal incision near the wrist was opened with a 10 blade.  I began dissecting out the pseudoaneurysm.  The vein was inadvertently entered as the tissue was very unhealthy.  I ended up having to place a tourniquet in the upper arm for control.  I then dissected out the cephalic vein distally and then up to the anastomosis.  I placed a Cooley clamp across the arterial venous anastomosis and then divided the cephalic vein, resecting the degenerated area.  I then performed a  end-to-end anastomosis which theoretically narrowed down the fistula.  I also performed a fistulogram.  This showed the central venous system is widely patent.  The cephalic vein is widely patent throughout however it does drain into the basilic system at the antecubital crease.  After the fistulogram, completed the anastomosis.  There was an excellent thrill within the fistula.  Patient had a brisk Doppler signal in the palmar arch.  Next, the wound was irrigated.  Hemostasis was achieved.  The incision was then closed with 2 layers of Vicryl followed by Dermabond.  There were no immediate complications.   Disposition: To PACU stable.   Theotis Burrow, M.D., [facility] [place] Office: [phone] Pager:  [phone]

## 2019-04-30 ENCOUNTER — Telehealth (HOSPITAL_COMMUNITY): Payer: Self-pay | Admitting: Rehabilitation

## 2019-04-30 NOTE — Telephone Encounter (Signed)

## 2019-05-03 ENCOUNTER — Other Ambulatory Visit: Payer: Self-pay

## 2019-05-03 ENCOUNTER — Encounter: Payer: Self-pay | Admitting: Family

## 2019-05-03 ENCOUNTER — Ambulatory Visit (INDEPENDENT_AMBULATORY_CARE_PROVIDER_SITE_OTHER): Payer: Self-pay | Admitting: Family

## 2019-05-03 VITALS — BP 195/109 | HR 81 | Temp 97.7°F | Resp 12 | Ht 71.0 in | Wt 197.4 lb

## 2019-05-03 DIAGNOSIS — I770 Arteriovenous fistula, acquired: Secondary | ICD-10-CM

## 2019-05-03 DIAGNOSIS — Z992 Dependence on renal dialysis: Secondary | ICD-10-CM

## 2019-05-03 DIAGNOSIS — N186 End stage renal disease: Secondary | ICD-10-CM

## 2019-05-03 NOTE — Progress Notes (Signed)
CC: post op follow up revision left forearm AVF  History of Present Illness  Jacob Colon is a 63 y.o. (03/13/1956) male who is s/p left arm fistulogram and repair of left radiocephalic vein fistula.  Pseudoaneurysm on 04-07-19 by Dr. Trula Slade for numbness in his left first 3 fingers.  He returns today for post op follow up.   He dialyzes TTS via left forearm AVF.  Left wrist incision is well healed, no signs of infection.  He denies fever or chills.   He states that the numbness in his left first fingers has improved enough, he states that he does not want to tie this AVF off and have another one created.   He states that he is primarily right hand dominant.   Past Medical History:  Diagnosis Date  . Anal fissure, unspecified 04/29/2018  . Anemia in chronic kidney disease 04/29/2018  . Anxiety disorder 04/29/2018  . Arteriovenous fistula, acquired ([place]) 04/29/2018  . Cellulitis of left upper limb 09/08/2018  . Coagulation defect ([place]) 04/29/2018  . Discitis, unspecified, site unspecified 04/29/2018  . Dysrhythmia    IRREGULAR HEARTRATE  . ESRD (end stage renal disease) ([place]) 04/29/2018  . Fluid overload 07/13/2018  . GERD without esophagitis 04/29/2018  . 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 Hyperkalemia 06/09/2018  . Hypertension   . Hypertensive chronic kidney disease with stage 1 through stage 4 chronic kidney disease, or unspecified chronic kidney disease 04/29/2018  . Hypokalemia 04/30/2018  . Iron deficiency anemia 05/27/2018  . Major depressive disorder, single episode, unspecified 04/29/2018  . Other mechanical complication of surgically created arteriovenous fistula, subsequent encounter 11/10/2018  . Pain, unspecified 04/29/2018  . Polycystic kidney, unspecified 04/29/2018  . Pruritus, unspecified 04/29/2018  . Secondary hyperparathyroidism of renal origin ([place]) 06/04/2018  . Sleep apnea   . Stroke [facility]    hx of 2 mini strokes - most recent was in 2020  .  Vitamin D deficiency 04/29/2018    Social History Social History   Tobacco Use  . Smoking status: Former Research scientist (life sciences)  . Smokeless tobacco: Former Network engineer Use Topics  . Alcohol use: Not Currently  . Drug use: Yes    Types: Marijuana    Comment: daily    Family History History reviewed. No pertinent family history.  Surgical History Past Surgical History:  Procedure Laterality Date  . ANAL FISSURE REPAIR    . AV FISTULA PLACEMENT    . COLONOSCOPY    . FISTULOGRAM Left 04/07/2019   Procedure: FISTULOGRAM  WITH INTERVENTION OF ARTERIOVENOUS FISTULA LEFT ARM;  Surgeon: Serafina Mitchell, MD;  Location: [place];  Service: Vascular;  Laterality: Left;  . REVISION OF ARTERIOVENOUS GORETEX GRAFT Left 04/07/2019   Procedure: BANDING AND REPAIR OF PSEUDOANEURYSM ARTERIOVENOUS FISTULA LEFT ARM;  Surgeon: Serafina Mitchell, MD;  Location: [place];  Service: Vascular;  Laterality: Left;    No Known Allergies  Current Outpatient Medications  Medication Sig Dispense Refill  . amLODipine (NORVASC) 10 MG tablet Take 10 mg by mouth See admin instructions. Takes night before dialysis - Mon Wed and Fri night    . busPIRone (BUSPAR) 10 MG tablet Take 5 mg by mouth 4 (four) times daily.     . carvedilol (COREG) 3.125 MG tablet Take 3.125 mg by mouth See admin instructions. Takes night before dialysis - Mon Wed and Fri night    . hydrALAZINE (APRESOLINE) 10 MG tablet Take 10 mg by mouth See admin instructions. Takes night before  dialysis - Mon Wed and Fri night    . meclizine (ANTIVERT) 25 MG tablet Take 25 mg by mouth 3 (three) times daily as needed for dizziness.    Marland Kitchen omeprazole (PRILOSEC) 20 MG capsule Take 20 mg by mouth See admin instructions. Takes night before dialysis - Mon Wed and Fri night    . sucroferric oxyhydroxide (VELPHORO) 500 MG chewable tablet Chew 1,500 mg by mouth 3 (three) times daily with meals.     Marland Kitchen tiZANidine (ZANAFLEX) 4 MG tablet Take 4 mg by mouth every 6 (six) hours as needed  for muscle spasms.     No current facility-administered medications for this visit.      REVIEW OF SYSTEMS: see HPI for pertinent positives and negatives    PHYSICAL EXAMINATION:  Vitals:   05/03/19 1329  BP: (!) 195/109  Pulse: 81  Resp: 12  Temp: 97.7 F (36.5 C)  TempSrc: Temporal  SpO2: 99%  Weight: 197 lb 6.4 oz (89.5 kg)  Height: 5\' 11"  (1.803 m)   Body mass index is 27.53 kg/m.  General: The patient appears his stated age.   HEENT:  No gross abnormalities Pulmonary: Respirations are non-labored Musculoskeletal: There are no major deformities.   Neurologic: No focal weakness or paresthesias are detected, Skin: There are no ulcer or rashes noted. Psychiatric: The patient has normal affect. Cardiovascular: There is a regular rate and rhythm Left forearm AVF with brisk thrill and bruit.   Non-Invasive Vascular Imaging  none  Medical Decision Making  Jacob Colon is a 63 y.o. male who is s/p left arm fistulogram and repair of left radiocephalic vein fistula.  Pseudoaneurysm on 04-07-19 by Dr. Trula Slade for numbness in his left first 3 fingers.    Left wrist incision is well healed with no signs of infection.  He states that the numbness in his left finger has improved enough.   [name], RN, MSN, FNP-C [facility] [place] Office: [phone]  05/03/2019, 1:49 PM  Clinic MD: Trula Slade

## 2019-05-17 DIAGNOSIS — T82511S Breakdown (mechanical) of surgically created arteriovenous shunt, sequela: Secondary | ICD-10-CM

## 2019-05-17 HISTORY — DX: Breakdown (mechanical) of surgically created arteriovenous shunt, sequela: T82.511S

## 2019-05-24 ENCOUNTER — Emergency Department (HOSPITAL_COMMUNITY)
Admission: EM | Admit: 2019-05-24 | Discharge: 2019-05-24 | Disposition: A | Discharge status: Home / Self Care | Payer: No Typology Code available for payment source | Attending: Emergency Medicine | Admitting: Emergency Medicine

## 2019-05-24 ENCOUNTER — Encounter (HOSPITAL_COMMUNITY): Payer: Self-pay

## 2019-05-24 ENCOUNTER — Emergency Department (HOSPITAL_COMMUNITY): Payer: No Typology Code available for payment source

## 2019-05-24 ENCOUNTER — Other Ambulatory Visit: Payer: Self-pay

## 2019-05-24 DIAGNOSIS — N186 End stage renal disease: Secondary | ICD-10-CM | POA: Diagnosis not present

## 2019-05-24 DIAGNOSIS — R0602 Shortness of breath: Secondary | ICD-10-CM | POA: Diagnosis present

## 2019-05-24 DIAGNOSIS — I120 Hypertensive chronic kidney disease with stage 5 chronic kidney disease or end stage renal disease: Secondary | ICD-10-CM | POA: Diagnosis not present

## 2019-05-24 DIAGNOSIS — Z992 Dependence on renal dialysis: Secondary | ICD-10-CM | POA: Diagnosis not present

## 2019-05-24 DIAGNOSIS — Z87891 Personal history of nicotine dependence: Secondary | ICD-10-CM | POA: Insufficient documentation

## 2019-05-24 DIAGNOSIS — Z79899 Other long term (current) drug therapy: Secondary | ICD-10-CM | POA: Diagnosis not present

## 2019-05-24 LAB — BASIC METABOLIC PANEL
Anion gap: 13 (ref 5–15)
BUN: 19 mg/dL (ref 6–20)
CO2: 27 mmol/L (ref 22–32)
Calcium: 7.7 mg/dL — ABNORMAL LOW (ref 8.9–10.3)
Chloride: 98 mmol/L (ref 98–111)
Creatinine, Ser: 7.44 mg/dL — ABNORMAL HIGH (ref 0.61–1.24)
GFR calc Af Amer: 8 mL/min — ABNORMAL LOW (ref >60)
GFR calc non Af Amer: 7 mL/min — ABNORMAL LOW (ref >60)
Glucose, Bld: 84 mg/dL (ref 70–99)
Potassium: 3.8 mmol/L (ref 3.5–5.1)
Sodium: 138 mmol/L (ref 135–145)

## 2019-05-24 LAB — CBC WITH DIFFERENTIAL/PLATELET
Abs Immature Granulocytes: 0.01 10*3/uL (ref 0.00–0.07)
Basophils Absolute: 0 10*3/uL (ref 0.0–0.1)
Basophils Relative: 1 %
Eosinophils Absolute: 0.2 10*3/uL (ref 0.0–0.5)
Eosinophils Relative: 5 %
HCT: 28.7 % — ABNORMAL LOW (ref 39.0–52.0)
Hemoglobin: 9.3 g/dL — ABNORMAL LOW (ref 13.0–17.0)
Immature Granulocytes: 0 %
Lymphocytes Relative: 20 %
Lymphs Abs: 0.9 10*3/uL (ref 0.7–4.0)
MCH: 33.2 pg (ref 26.0–34.0)
MCHC: 32.4 g/dL (ref 30.0–36.0)
MCV: 102.5 fL — ABNORMAL HIGH (ref 80.0–100.0)
Monocytes Absolute: 0.4 10*3/uL (ref 0.1–1.0)
Monocytes Relative: 10 %
Neutro Abs: 2.8 10*3/uL (ref 1.7–7.7)
Neutrophils Relative %: 64 %
Platelets: 154 10*3/uL (ref 150–400)
RBC: 2.8 MIL/uL — ABNORMAL LOW (ref 4.22–5.81)
RDW: 14.2 % (ref 11.5–15.5)
WBC: 4.4 10*3/uL (ref 4.0–10.5)
nRBC: 0 % (ref 0.0–0.2)

## 2019-05-24 LAB — BRAIN NATRIURETIC PEPTIDE: B Natriuretic Peptide: 3836.1 pg/mL — ABNORMAL HIGH (ref 0.0–100.0)

## 2019-05-24 MED ORDER — CARVEDILOL 3.125 MG PO TABS
3.1250 mg | ORAL_TABLET | Freq: Once | ORAL | Status: AC
  Administered 2019-05-24: 3.125 mg via ORAL
  Filled 2019-05-24: qty 1

## 2019-05-24 MED ORDER — AMLODIPINE BESYLATE 5 MG PO TABS
10.0000 mg | ORAL_TABLET | Freq: Once | ORAL | Status: AC
  Administered 2019-05-24: 11:00:00 10 mg via ORAL
  Filled 2019-05-24: qty 2

## 2019-05-24 MED ORDER — HYDRALAZINE HCL 10 MG PO TABS
10.0000 mg | ORAL_TABLET | Freq: Once | ORAL | Status: AC
  Administered 2019-05-24: 12:00:00 10 mg via ORAL
  Filled 2019-05-24: qty 1

## 2019-05-24 NOTE — Discharge Instructions (Addendum)
Today you were seen for shortness of breath. Based on your symptoms it sounds like you might have sleep apnea. Given some of the findings on your chest x-ray and labs, you might also have heart failure. Your primary care provider can refer you for an additional test to evaluate these concerns.  Please return to the emergency room if you develop severe shortness of breath, chest pain, or any other concerning symptom.  Thank you for allowing [place] to be part of your medical care

## 2019-05-24 NOTE — ED Triage Notes (Signed)
Pt from dialysis via ems; c/o sob x 1 month, increased over last 3 days; hx anxiety, out of buspirone x 3 days; received half (2/4 hours) of dialysis today; has not taken meds since yesterday am  176/100 99% RA (2L for comfort) HR 92 RR 18

## 2019-05-24 NOTE — ED Provider Notes (Signed)
[facility] EMERGENCY DEPARTMENT Provider Note   CSN: [id number] Arrival date & time: 05/24/19  1010     History   Chief Complaint Chief Complaint  Patient presents with  . Shortness of Breath    HPI Jacob Colon is a 63 y.o. male with past medical history of hypertension, anxiety, ESRD on dialysis MWF.  Patient reports that he was at dialysis session this morning and was 2 hours into session when he started to have shortness of breath anxiety and was instructed to come to emergency room.  Reports shortness of breath and anxiety for the past couple of months.  Shortness of breath is episodic and accompanied by tachycardia and anxiety.  Reports that SOB is worse with exertion, worse when lying flat, sometimes wakes him up at night, and anxiety episodes are occasionally accompanied by nausea and vomiting.  Patient denies chest pain, fever, chills.  Reports occasional headaches. Patient reports 43-month history of cough and congestion.  Patient states that he has chronic anxiety and PTSD from his service in the [place] war and he sees a psychiatrist at the [place] who has prescribed him buspirone.  Patient states that he often takes more than his prescribed and this helps with his anxiety.     HPI  Past Medical History:  Diagnosis Date  . Anal fissure, unspecified 04/29/2018  . Anemia in chronic kidney disease 04/29/2018  . Anxiety disorder 04/29/2018  . Arteriovenous fistula, acquired ([place]) 04/29/2018  . Cellulitis of left upper limb 09/08/2018  . Coagulation defect ([place]) 04/29/2018  . Discitis, unspecified, site unspecified 04/29/2018  . Dysrhythmia    IRREGULAR HEARTRATE  . ESRD (end stage renal disease) ([place]) 04/29/2018  . Fluid overload 07/13/2018  . GERD without esophagitis 04/29/2018  . Huntington's disease ([place])   . Hyperkalemia 06/09/2018  . Hypertension   . Hypertensive chronic kidney disease with stage 1 through stage 4 chronic kidney disease, or  unspecified chronic kidney disease 04/29/2018  . Hypokalemia 04/30/2018  . Iron deficiency anemia 05/27/2018  . Major depressive disorder, single episode, unspecified 04/29/2018  . Other mechanical complication of surgically created arteriovenous fistula, subsequent encounter 11/10/2018  . Pain, unspecified 04/29/2018  . Polycystic kidney, unspecified 04/29/2018  . Pruritus, unspecified 04/29/2018  . Secondary hyperparathyroidism of renal origin ([place]) 06/04/2018  . Sleep apnea   . Stroke [facility])    hx of 2 mini strokes - most recent was in 2020  . Vitamin D deficiency 04/29/2018    There are no active problems to display for this patient.   Past Surgical History:  Procedure Laterality Date  . ANAL FISSURE REPAIR    . AV FISTULA PLACEMENT    . COLONOSCOPY    . FISTULOGRAM Left 04/07/2019   Procedure: FISTULOGRAM  WITH INTERVENTION OF ARTERIOVENOUS FISTULA LEFT ARM;  Surgeon: Serafina Mitchell, MD;  Location: [facility];  Service: Vascular;  Laterality: Left;  . REVISION OF ARTERIOVENOUS GORETEX GRAFT Left 04/07/2019   Procedure: BANDING AND REPAIR OF PSEUDOANEURYSM ARTERIOVENOUS FISTULA LEFT ARM;  Surgeon: Serafina Mitchell, MD;  Location: [facility] OR;  Service: Vascular;  Laterality: Left;        Home Medications    Prior to Admission medications   Medication Sig Start Date End Date Taking? Authorizing Provider  amLODipine (NORVASC) 10 MG tablet Take 10 mg by mouth See admin instructions. Takes night before dialysis - Mon Wed and Fri night   Yes [provider]  busPIRone (BUSPAR) 10 MG tablet Take 5 mg  by mouth 4 (four) times daily.    Yes [provider]  carvedilol (COREG) 3.125 MG tablet Take 3.125 mg by mouth daily. Takes night before dialysis - Mon Wed and Fri night only. All other days, take in morning   Yes [provider]  hydrALAZINE (APRESOLINE) 10 MG tablet Take 10 mg by mouth See admin instructions. Takes night before dialysis - Mon Wed and Fri night   Yes  [provider]  meclizine (ANTIVERT) 25 MG tablet Take 25 mg by mouth 3 (three) times daily as needed for dizziness.   Yes [provider]  omeprazole (PRILOSEC) 20 MG capsule Take 20 mg by mouth See admin instructions. Takes night before dialysis - Mon Wed and Fri night   Yes [provider]  sucroferric oxyhydroxide (VELPHORO) 500 MG chewable tablet Chew 500 mg by mouth 3 (three) times daily with meals.    Yes [provider]  tiZANidine (ZANAFLEX) 4 MG tablet Take 4 mg by mouth every 6 (six) hours as needed for muscle spasms.   Yes [provider]    Family History History reviewed. No pertinent family history.  Social History Social History   Tobacco Use  . Smoking status: Former Research scientist (life sciences)  . Smokeless tobacco: Former Network engineer Use Topics  . Alcohol use: Not Currently  . Drug use: Yes    Types: Marijuana    Comment: daily     Allergies   Patient has no known allergies.   Review of Systems Review of Systems  Constitutional: Negative for chills and fatigue.  HENT: Positive for congestion.   Respiratory: Positive for cough and shortness of breath.   Cardiovascular: Negative for chest pain.  Gastrointestinal: Positive for nausea and vomiting. Negative for abdominal pain.  All other systems reviewed and are negative.    Physical Exam Updated Vital Signs BP (!) 161/110   Pulse 89   Temp 97.7 F (36.5 C) (Oral)   Resp (!) 25   SpO2 96%   Physical Exam Constitutional:      Appearance: Normal appearance.  HENT:     Head: Normocephalic and atraumatic.     Right Ear: External ear normal.     Left Ear: External ear normal.  Neck:     Musculoskeletal: Neck supple.  Cardiovascular:     Rate and Rhythm: Normal rate and regular rhythm.     Heart sounds: Normal heart sounds. No murmur. No friction rub. No gallop.   Pulmonary:     Breath sounds: Normal breath sounds. No wheezing, rhonchi or rales.  Abdominal:      General: Abdomen is flat. There is no distension.     Palpations: Abdomen is soft.     Tenderness: There is no abdominal tenderness. There is no guarding.  Musculoskeletal:        General: No swelling or tenderness.  Skin:    General: Skin is warm and dry.  Neurological:     Mental Status: He is alert.  Psychiatric:        Mood and Affect: Mood normal.        Behavior: Behavior normal.      ED Treatments / Results  Labs (all labs ordered are listed, but only abnormal results are displayed) Labs Reviewed  CBC WITH DIFFERENTIAL/PLATELET - Abnormal; Notable for the following components:      Result Value   RBC 2.80 (*)    Hemoglobin 9.3 (*)    HCT 28.7 (*)    MCV  102.5 (*)    All other components within normal limits  BASIC METABOLIC PANEL - Abnormal; Notable for the following components:   Creatinine, Ser 7.44 (*)    Calcium 7.7 (*)    GFR calc non Af Amer 7 (*)    GFR calc Af Amer 8 (*)    All other components within normal limits  BRAIN NATRIURETIC PEPTIDE - Abnormal; Notable for the following components:   B Natriuretic Peptide 3,836.1 (*)    All other components within normal limits    EKG EKG Interpretation  Date/Time:  Monday May 24 2019 10:18:52 EDT Ventricular Rate:  90 PR Interval:    QRS Duration: 98 QT Interval:  374 QTC Calculation: 458 R Axis:   50 Text Interpretation:  Sinus rhythm Probable left ventricular hypertrophy Nonspecific T abnormalities, lateral leads biphasic t waves in the lateral leads Otherwise no significant change Confirmed by Deno Etienne [id number]) on 05/24/2019 10:38:34 AM   Radiology Dg Chest 2 View  Result Date: 05/24/2019 CLINICAL DATA:  Shortness of breath. EXAM: CHEST - 2 VIEW COMPARISON:  No prior. FINDINGS: Mediastinum hilar structures normal. Cardiomegaly and mild pulmonary venous congestion. Diffuse bilateral interstitial prominence. Findings consistent with interstitial edema and/or pneumonitis. Tiny bilateral pleural  effusions cannot be excluded. No pneumothorax. IMPRESSION: 1.  Cardiomegaly and mild pulmonary venous congestion. 2. Diffuse bilateral interstitial prominence findings consistent interstitial edema and/or pneumonitis. Small bilateral pleural effusions cannot be excluded. Electronically Signed   By: Marcello Moores  Register   On: 05/24/2019 11:42    Procedures Procedures (including critical care time)  Medications Ordered in ED Medications  amLODipine (NORVASC) tablet 10 mg (10 mg Oral Given 05/24/19 1101)  hydrALAZINE (APRESOLINE) tablet 10 mg (10 mg Oral Given 05/24/19 1136)  carvedilol (COREG) tablet 3.125 mg (3.125 mg Oral Given 05/24/19 1148)     Initial Impression / Assessment and Plan / ED Course  I have reviewed the triage vital signs and the nursing notes.  Pertinent labs & imaging results that were available during my care of the patient were reviewed by me and considered in my medical decision making (see chart for details).        Mr. Gaulding is a 63 year old male who presents with SOB and anxiety.  Chest x-ray shows diffuse bilateral interstitial prominence suggestive of interstitial edema and also demonstrates cardiomegaly.  Patient also with elevated BNP to 3836.  In setting of ESRD and shorter dialysis session today, x-ray and BNP findings suggest volume overload status from renal failure versus heart failure.  Patient is oxygenating well and breathing comfortably on room air.  Patient may benefit from echocardiography study as an outpatient to further work-up for heart failure.  Given symptoms of shortness of breath, nighttime awakenings, headaches patient may also have sleep apnea which can be evaluated as outpatient. Patient had elevated blood pressure to 201/113 on presentation - patient missed last nights dose of hypertensive medications. Patient given home dose of amlodipine, hydralazine, and carvedilol and pressure was 160/110 at time of discharge.  Findings discussed with  patient and patient comfortable with plan of care.  Final Clinical Impressions(s) / ED Diagnoses   Final diagnoses:  None    ED Discharge Orders    None       Jeanmarie Hubert, MD 05/24/19 [place], [place], DO 05/24/19 [phone]

## 2019-05-24 NOTE — ED Notes (Signed)
Provided pt a urinal for sample

## 2019-12-15 DIAGNOSIS — T7840XA Allergy, unspecified, initial encounter: Secondary | ICD-10-CM

## 2019-12-15 HISTORY — DX: Allergy, unspecified, initial encounter: T78.40XA

## 2020-01-06 DIAGNOSIS — R0602 Shortness of breath: Secondary | ICD-10-CM

## 2020-01-06 HISTORY — DX: Shortness of breath: R06.02

## 2020-03-09 ENCOUNTER — Emergency Department (HOSPITAL_COMMUNITY): Payer: No Typology Code available for payment source

## 2020-03-09 ENCOUNTER — Emergency Department (HOSPITAL_COMMUNITY)
Admission: EM | Admit: 2020-03-09 | Discharge: 2020-03-09 | Disposition: A | Discharge status: Home / Self Care | Payer: No Typology Code available for payment source | Attending: Emergency Medicine | Admitting: Emergency Medicine

## 2020-03-09 ENCOUNTER — Other Ambulatory Visit: Payer: Self-pay

## 2020-03-09 ENCOUNTER — Encounter (HOSPITAL_COMMUNITY): Payer: Self-pay | Admitting: Emergency Medicine

## 2020-03-09 DIAGNOSIS — Z992 Dependence on renal dialysis: Secondary | ICD-10-CM | POA: Insufficient documentation

## 2020-03-09 DIAGNOSIS — R0602 Shortness of breath: Secondary | ICD-10-CM | POA: Diagnosis present

## 2020-03-09 DIAGNOSIS — N186 End stage renal disease: Secondary | ICD-10-CM | POA: Insufficient documentation

## 2020-03-09 DIAGNOSIS — I120 Hypertensive chronic kidney disease with stage 5 chronic kidney disease or end stage renal disease: Secondary | ICD-10-CM | POA: Insufficient documentation

## 2020-03-09 DIAGNOSIS — Z87891 Personal history of nicotine dependence: Secondary | ICD-10-CM | POA: Diagnosis not present

## 2020-03-09 LAB — BASIC METABOLIC PANEL
Anion gap: 13 (ref 5–15)
BUN: 25 mg/dL — ABNORMAL HIGH (ref 8–23)
CO2: 29 mmol/L (ref 22–32)
Calcium: 8.8 mg/dL — ABNORMAL LOW (ref 8.9–10.3)
Chloride: 95 mmol/L — ABNORMAL LOW (ref 98–111)
Creatinine, Ser: 6.34 mg/dL — ABNORMAL HIGH (ref 0.61–1.24)
GFR calc Af Amer: 10 mL/min — ABNORMAL LOW (ref >60)
GFR calc non Af Amer: 9 mL/min — ABNORMAL LOW (ref >60)
Glucose, Bld: 101 mg/dL — ABNORMAL HIGH (ref 70–99)
Potassium: 4.3 mmol/L (ref 3.5–5.1)
Sodium: 137 mmol/L (ref 135–145)

## 2020-03-09 LAB — BRAIN NATRIURETIC PEPTIDE: B Natriuretic Peptide: >4500 pg/mL — ABNORMAL HIGH (ref 0.0–100.0)

## 2020-03-09 LAB — CBC
HCT: 34 % — ABNORMAL LOW (ref 39.0–52.0)
Hemoglobin: 10.8 g/dL — ABNORMAL LOW (ref 13.0–17.0)
MCH: 33.5 pg (ref 26.0–34.0)
MCHC: 31.8 g/dL (ref 30.0–36.0)
MCV: 105.6 fL — ABNORMAL HIGH (ref 80.0–100.0)
Platelets: 110 10*3/uL — ABNORMAL LOW (ref 150–400)
RBC: 3.22 MIL/uL — ABNORMAL LOW (ref 4.22–5.81)
RDW: 16.1 % — ABNORMAL HIGH (ref 11.5–15.5)
WBC: 3.8 10*3/uL — ABNORMAL LOW (ref 4.0–10.5)
nRBC: 0 % (ref 0.0–0.2)

## 2020-03-09 MED ORDER — CEPHALEXIN 500 MG PO CAPS: 500.0000 mg | ORAL_CAPSULE | Freq: Four times a day (QID) | ORAL | 0 refills | Status: AC

## 2020-03-09 MED ORDER — LORAZEPAM 1 MG PO TABS: 1.0000 mg | ORAL_TABLET | Freq: Three times a day (TID) | ORAL | 0 refills | Status: AC | PRN

## 2020-03-09 MED ORDER — ALBUTEROL SULFATE (2.5 MG/3ML) 0.083% IN NEBU
5.0000 mg | INHALATION_SOLUTION | Freq: Once | RESPIRATORY_TRACT | Status: AC
  Administered 2020-03-09: 5 mg via RESPIRATORY_TRACT
  Filled 2020-03-09: qty 6

## 2020-03-09 NOTE — ED Notes (Signed)
The pt has multiple complaints he is asking for water and food  He reports that he is a dialysis pt and he hurts everywhere

## 2020-03-09 NOTE — ED Triage Notes (Addendum)
Pt presents to ED POv. Pt c/o SOB and orthopnea for multiple weeks. Pt also c/o fatigue. Pt dialysis m/w/f and reports not missing any treatments. Report he recntly started lasix but does not produce much urine anymore. Resp e/u

## 2020-03-09 NOTE — ED Provider Notes (Signed)
Emergency Department Provider Note  I have reviewed the triage vital signs and the nursing notes.  HISTORY  Chief Complaint Shortness of Breath   HPI Jacob Colon is a 64 y.o. male with multiple medical problems documented below who presents to the emergency department today secondary to dyspnea.  Patient states he has neurolysed weakness along with mild dyspnea on exertion and orthopnea.  Is been going on for a while has been a figured out.  His nephrologist is evaluating for an.  Out either.  No chest pain or productive cough.  No fevers.  He has bilateral lower extremity swelling intermittently.  He is compliant with his dialysis Monday Wednesday Friday.  Compliant with diet.   No other associated or modifying symptoms.    Past Medical History:  Diagnosis Date  . Anal fissure, unspecified 04/29/2018  . Anemia in chronic kidney disease 04/29/2018  . Anxiety disorder 04/29/2018  . Arteriovenous fistula, acquired ([place]) 04/29/2018  . Cellulitis of left upper limb 09/08/2018  . Coagulation defect ([place]) 04/29/2018  . Discitis, unspecified, site unspecified 04/29/2018  . Dysrhythmia    IRREGULAR HEARTRATE  . ESRD (end stage renal disease) ([place]) 04/29/2018  . Fluid overload 07/13/2018  . GERD without esophagitis 04/29/2018  . Huntington's disease ([place])   . Hyperkalemia 06/09/2018  . Hypertension   . Hypertensive chronic kidney disease with stage 1 through stage 4 chronic kidney disease, or unspecified chronic kidney disease 04/29/2018  . Hypokalemia 04/30/2018  . Iron deficiency anemia 05/27/2018  . Major depressive disorder, single episode, unspecified 04/29/2018  . Other mechanical complication of surgically created arteriovenous fistula, subsequent encounter 11/10/2018  . Pain, unspecified 04/29/2018  . Polycystic kidney, unspecified 04/29/2018  . Pruritus, unspecified 04/29/2018  . Secondary hyperparathyroidism of renal origin ([place]) 06/04/2018  . Sleep apnea   . Stroke  [facility])    hx of 2 mini strokes - most recent was in 2020  . Vitamin D deficiency 04/29/2018    There are no problems to display for this patient.   Past Surgical History:  Procedure Laterality Date  . ANAL FISSURE REPAIR    . AV FISTULA PLACEMENT    . COLONOSCOPY    . FISTULOGRAM Left 04/07/2019   Procedure: FISTULOGRAM  WITH INTERVENTION OF ARTERIOVENOUS FISTULA LEFT ARM;  Surgeon: Serafina Mitchell, MD;  Location: [facility];  Service: Vascular;  Laterality: Left;  . REVISION OF ARTERIOVENOUS GORETEX GRAFT Left 04/07/2019   Procedure: BANDING AND REPAIR OF PSEUDOANEURYSM ARTERIOVENOUS FISTULA LEFT ARM;  Surgeon: Serafina Mitchell, MD;  Location: [facility];  Service: Vascular;  Laterality: Left;    Current Outpatient Rx  . Order #: [id number] Class: Historical Med  . Order #: [id number] Class: Historical Med  . Order #: [id number] Class: Historical Med  . Order #: [id number] Class: Historical Med  . Order #: [id number] Class: Historical Med  . Order #: [id number] Class: Historical Med  . Order #: [id number] Class: Historical Med  . Order #: [id number] Class: Historical Med    Allergies Patient has no known allergies.  History reviewed. No pertinent family history.  Social History Social History   Tobacco Use  . Smoking status: Former Research scientist (life sciences)  . Smokeless tobacco: Former Network engineer  . Vaping Use: Never used  Substance Use Topics  . Alcohol use: Not Currently  . Drug use: Yes    Types: Marijuana    Comment: daily    Review of Systems  All other systems negative except as documented in the HPI. All pertinent positives and negatives  as reviewed in the HPI. ____________________________________________  PHYSICAL EXAM:  VITAL SIGNS: ED Triage Vitals  Enc Vitals Group     BP 03/09/20 0403 (!) 168/110     Pulse Rate 03/09/20 0403 91     Resp 03/09/20 0403 16     Temp 03/09/20 0403 98.6 F (37 C)     Temp Source 03/09/20 0403 Oral     SpO2 03/09/20 0403 98 %     Weight 03/09/20 0402  190 lb (86.2 kg)     Height 03/09/20 0402 5\' 11"  (1.803 m)    Constitutional: Alert and oriented. Well appearing and in no acute distress. Eyes: Conjunctivae are normal. PERRL. EOMI. Head: Atraumatic. Nose: No congestion/rhinnorhea. Mouth/Throat: Mucous membranes are moist.  Oropharynx non-erythematous. Neck: No stridor.  No meningeal signs.   Cardiovascular: Normal rate, regular rhythm. Good peripheral circulation. Grossly normal heart sounds.   Respiratory: Normal respiratory effort.  No retractions. Lungs CTAB. Gastrointestinal: Soft and nontender. No distention.  Musculoskeletal: No lower extremity tenderness nor edema. No gross deformities of extremities. Neurologic:  Normal speech and language. No gross focal neurologic deficits are appreciated.  Skin:  Skin is warm, dry and intact. No rash noted. Caked on dirt noted to arms and legs and feet. Fistula in left arm intact.  ____________________________________________   LABS (all labs ordered are listed, but only abnormal results are displayed)  Labs Reviewed  BASIC METABOLIC PANEL - Abnormal; Notable for the following components:      Result Value   Chloride 95 (*)    Glucose, Bld 101 (*)    BUN 25 (*)    Creatinine, Ser 6.34 (*)    Calcium 8.8 (*)    GFR calc non Af Amer 9 (*)    GFR calc Af Amer 10 (*)    All other components within normal limits  BRAIN NATRIURETIC PEPTIDE - Abnormal; Notable for the following components:   B Natriuretic Peptide >4,500.0 (*)    All other components within normal limits  CBC   ____________________________________________  EKG   EKG Interpretation  Date/Time:  Thursday March 09 2020 03:54:04 EDT Ventricular Rate:  92 PR Interval:  154 QRS Duration: 86 QT Interval:  372 QTC Calculation: 460 R Axis:   63 Text Interpretation: Normal sinus rhythm T wave abnormality, consider inferior ischemia Prolonged QT Abnormal ECG No significant change since last tracing Confirmed by Merrily Pew [phone]) on 03/09/2020 5:57:33 AM       ____________________________________________  RADIOLOGY  DG Chest 2 View  Result Date: 03/09/2020 CLINICAL DATA:  Shortness of breath, dialysis EXAM: CHEST - 2 VIEW COMPARISON:  Radiograph 06/23/2019 FINDINGS: There is diffusely coarsened interstitial opacities throughout the lungs some of which can be attributable to chronic dialysis or longstanding edematous changes though some acute component of edema is suspected with supporting features of pulmonary vascular congestion and cephalization as well as fissural and septal thickening. There is a small right pleural effusion. No visible left effusion. No pneumothorax. Mild cardiomegaly similar to priors. Calcified tortuous aorta is again seen. No acute osseous or soft tissue abnormality. Degenerative changes are present in the imaged spine and shoulders. IMPRESSION: 1. Findings suggestive of volume overload/CHF with cardiomegaly, pulmonary vascular congestion and interstitial edema. 2. Small right pleural effusion. Electronically Signed   By: Lovena Le M.D.   On: 03/09/2020 04:27   ____________________________________________  PROCEDURES  Procedure(s) performed:   Procedures ____________________________________________  INITIAL IMPRESSION / ASSESSMENT AND PLAN / ED COURSE   This patient  presents to the ED for concern of dyspnea and anxiety, this involves an extensive number of treatment options, and is a complaint that carries with it a high risk of complications and morbidity.  The differential diagnosis includes CHF, fluid overload, end-stage renal disease, pneumonia, pulmonary embolus, anxiety allergies.     Lab Tests:   I Ordered, reviewed, and interpreted labs, which included CBC, BMP and BNP  Medicines ordered:   I ordered medication none indicated   Imaging Studies ordered:   I independently visualized and interpreted imaging chest x-ray which showed mild diffuse changes  with small bilateral pleural effusions.  Additional history obtained:   Additional history obtained from noone  Previous records obtained and reviewed in epic  Consultations Obtained:   I consulted noone  and discussed lab and imaging findings  Reevaluation:  Patient consistently with a normal respiratory rate and oxygen saturations ranging from 93 to 99% on room air (there are a run of oxygens lower than this but I spoke with the nursing tech who stated they were innacurate because he wouldn't keep his finger still, repeat with good pleth was 93).  His lungs are clear.  His chest x-ray is unchanged from last year.  His BNP is elevated but this is in the setting of end-stage renal disease on dialysis and I am not sure how reliable it is as an acute indicator for fluid overload.  He is deftly not in any respiratory distress or have any other acute signs for the need for emergent dialysis.  We will asked to follow-up at his dialysis center for scheduled dialysis.  No hyperkalemia.  Patient also describes some anxiety.  Follow-up with pulmonary doctor for further management of this.  Thayer Jew any cardiology follow-up at some point as he never got an echocardiogram as needed from last year.  Could have new onset heart failure with his fatigue as well.  Once again no indication for admission.  Outpatient cardiology consult placed.  A medical screening exam was performed and I feel the patient has had an appropriate workup for their chief complaint at this time and likelihood of emergent condition existing is low. They have been counseled on decision, discharge, follow up and which symptoms necessitate immediate return to the emergency department. They or their family verbally stated understanding and agreement with plan and discharged in stable condition.   ____________________________________________  FINAL CLINICAL IMPRESSION(S) / ED DIAGNOSES  Final diagnoses:  None    MEDICATIONS GIVEN DURING  THIS VISIT:  Medications  albuterol (PROVENTIL) (2.5 MG/3ML) 0.083% nebulizer solution 5 mg (has no administration in time range)    NEW OUTPATIENT MEDICATIONS STARTED DURING THIS VISIT:  New Prescriptions   No medications on file    Note:  This note was prepared with assistance of Dragon voice recognition software. Occasional wrong-word or sound-a-like substitutions may have occurred due to the inherent limitations of voice recognition software.   Lyla Jasek, Corene Cornea, MD 03/09/20 [phone]

## 2020-03-21 ENCOUNTER — Ambulatory Visit (INDEPENDENT_AMBULATORY_CARE_PROVIDER_SITE_OTHER): Payer: Medicaid Other | Admitting: Cardiology

## 2020-03-21 ENCOUNTER — Telehealth: Payer: Self-pay | Admitting: Cardiology

## 2020-03-21 ENCOUNTER — Other Ambulatory Visit: Payer: Self-pay

## 2020-03-21 ENCOUNTER — Encounter: Payer: Self-pay | Admitting: Cardiology

## 2020-03-21 DIAGNOSIS — I10 Essential (primary) hypertension: Secondary | ICD-10-CM

## 2020-03-21 DIAGNOSIS — R0789 Other chest pain: Secondary | ICD-10-CM

## 2020-03-21 DIAGNOSIS — N186 End stage renal disease: Secondary | ICD-10-CM | POA: Diagnosis not present

## 2020-03-21 DIAGNOSIS — Z992 Dependence on renal dialysis: Secondary | ICD-10-CM

## 2020-03-21 DIAGNOSIS — R011 Cardiac murmur, unspecified: Secondary | ICD-10-CM

## 2020-03-21 HISTORY — DX: Cardiac murmur, unspecified: R01.1

## 2020-03-21 HISTORY — DX: Other chest pain: R07.89

## 2020-03-21 HISTORY — DX: Essential (primary) hypertension: I10

## 2020-03-21 MED ORDER — NITROGLYCERIN 0.4 MG SL SUBL
0.4000 mg | SUBLINGUAL_TABLET | SUBLINGUAL | 3 refills | Status: AC | PRN
Start: 2020-03-21 — End: 2020-06-19

## 2020-03-21 NOTE — Patient Instructions (Signed)
Medication Instructions:  No medication changes. *If you need a refill on your cardiac medications before your next appointment, please call your pharmacy*   Lab Work: None ordered If you have labs (blood work) drawn today and your tests are completely normal, you will receive your results only by: Marland Kitchen MyChart Message (if you have MyChart) OR . A paper copy in the mail If you have any lab test that is abnormal or we need to change your treatment, we will call you to review the results.   Testing/Procedures: Your physician has requested that you have an echocardiogram. Echocardiography is a painless test that uses sound waves to create images of your heart. It provides your doctor with information about the size and shape of your heart and how well your heart's chambers and valves are working. This procedure takes approximately one hour. There are no restrictions for this procedure.  Your physician has requested that you have a lexiscan myoview. For further information please visit [email or web address]. Please follow instruction sheet, as given.  The test will take approximately 3 to 4 hours to complete; you may bring reading material.  If someone comes with you to your appointment, they will need to remain in the main lobby due to limited space in the testing area. **If you are pregnant or breastfeeding, please notify the nuclear lab prior to your appointment**  How to prepare for your Myocardial Perfusion Test: . Do not eat or drink 3 hours prior to your test, except you may have water. . Do not consume products containing caffeine (regular or decaffeinated) 12 hours prior to your test. (ex: coffee, chocolate, sodas, tea). . Do bring a list of your current medications with you.  If not listed below, you may take your medications as normal. . Do wear comfortable clothes (no dresses or overalls) and walking shoes, tennis shoes preferred (No heels or open toe shoes are allowed). . Do NOT wear  cologne, perfume, aftershave, or lotions (deodorant is allowed). . If these instructions are not followed, your test will have to be rescheduled.    Follow-Up: At [facility], you and your health needs are our priority.  As part of our continuing mission to provide you with exceptional heart care, we have created designated Provider Care Teams.  These Care Teams include your primary Cardiologist (physician) and Advanced Practice Providers (APPs -  Physician Assistants and Nurse Practitioners) who all work together to provide you with the care you need, when you need it.  We recommend signing up for the patient portal called "MyChart".  Sign up information is provided on this After Visit Summary.  MyChart is used to connect with patients for Virtual Visits (Telemedicine).  Patients are able to view lab/test results, encounter notes, upcoming appointments, etc.  Non-urgent messages can be sent to your provider as well.   To learn more about what you can do with MyChart, go to [email or web address].    Your next appointment:   3 month(s)  The format for your next appointment:   In Person  Provider:   Jyl Heinz, MD   Other Instructions NA

## 2020-03-21 NOTE — Addendum Note (Signed)
Addended by: Truddie Hidden on: 03/21/2020 09:30 AM   Modules accepted: Orders

## 2020-03-21 NOTE — Telephone Encounter (Signed)
Patient would like the order for his stress test faxed to his PCP so that they can schedule it. Fax to [phone] ATTENTION: Dr. Henrene Pastor.

## 2020-03-21 NOTE — Telephone Encounter (Signed)
Faxed as requested

## 2020-03-21 NOTE — Progress Notes (Signed)
Cardiology Office Note:    Date:  03/21/2020   ID:  Jacob Colon, DOB 23-Sep-1956, MRN [id number]  PCP:  [facility]  Cardiologist:  Jenean Lindau, MD   Referring MD: Merrily Pew, MD    ASSESSMENT:    1. Chest discomfort   2. End stage renal disease on dialysis ([place])   3. Essential hypertension   4. Cardiac murmur    PLAN:    In order of problems listed above:  1. Chest discomfort atypical in nature: I reviewed emergency room records extensively and discussed with him.  In view of multiple risk factors we will do a Lexiscan sestamibi. 2. Sublingual nitroglycerin prescription was sent, its protocol and 911 protocol explained and the patient vocalized understanding questions were answered to the patient's satisfaction 3. Cardiac murmur: Echocardiogram will be done to assess this. 4. Essential hypertension: I am not sure about his blood pressure status.  He has not taken any medicines this morning.  I told him to be compliant with his medicines and to see his primary care physician on a regular basis for this. 5. End-stage renal disease on dialysis: Managed by nephrology in the dialysis center. 6. Patient will be seen in follow-up appointment in 6 months or earlier if the patient has any concerns.  He knows to go to nearest emergency room for any concerning symptoms.   Medication Adjustments/Labs and Tests Ordered: Current medicines are reviewed at length with the patient today.  Concerns regarding medicines are outlined above.  No orders of the defined types were placed in this encounter.  No orders of the defined types were placed in this encounter.    History of Present Illness:    Jacob Colon is a 64 y.o. male who is being seen today for the evaluation of chest discomfort at the request of Mesner, Corene Cornea, MD.  Patient is a 64 year old gentleman.  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  He mentions to me that he is homeless and sleeps and is well overall.  He mentions  to me that he occasionally has chest discomfort.  This is not related to exertion.  He says he suffers from anxiety.  No orthopnea or PND.  At the time of my evaluation, the patient is alert awake oriented and in no distress.  No radiation of the symptoms to the neck or to the arms.  Past Medical History:  Diagnosis Date  . Anal fissure, unspecified 04/29/2018  . Anemia in chronic kidney disease 04/29/2018  . Anxiety disorder 04/29/2018  . Arteriovenous fistula, acquired ([place]) 04/29/2018  . Cellulitis of left upper limb 09/08/2018  . Coagulation defect ([place]) 04/29/2018  . Discitis, unspecified, site unspecified 04/29/2018  . Dysrhythmia    IRREGULAR HEARTRATE  . ESRD (end stage renal disease) ([place]) 04/29/2018  . Fluid overload 07/13/2018  . GERD without esophagitis 04/29/2018  . Huntington's disease ([place])   . Hyperkalemia 06/09/2018  . Hypertension   . Hypertensive chronic kidney disease with stage 1 through stage 4 chronic kidney disease, or unspecified chronic kidney disease 04/29/2018  . Hypokalemia 04/30/2018  . Iron deficiency anemia 05/27/2018  . Major depressive disorder, single episode, unspecified 04/29/2018  . Other mechanical complication of surgically created arteriovenous fistula, subsequent encounter 11/10/2018  . Pain, unspecified 04/29/2018  . Polycystic kidney, unspecified 04/29/2018  . Pruritus, unspecified 04/29/2018  . Secondary hyperparathyroidism of renal origin ([place]) 06/04/2018  . Sleep apnea   . Stroke ([place])    hx of 2 mini strokes -  most recent was in 2020  . Vitamin D deficiency 04/29/2018    Past Surgical History:  Procedure Laterality Date  . ANAL FISSURE REPAIR    . AV FISTULA PLACEMENT    . COLONOSCOPY    . FISTULOGRAM Left 04/07/2019   Procedure: FISTULOGRAM  WITH INTERVENTION OF ARTERIOVENOUS FISTULA LEFT ARM;  Surgeon: Serafina Mitchell, MD;  Location: [facility];  Service: Vascular;  Laterality: Left;  . REVISION OF ARTERIOVENOUS GORETEX GRAFT Left  04/07/2019   Procedure: BANDING AND REPAIR OF PSEUDOANEURYSM ARTERIOVENOUS FISTULA LEFT ARM;  Surgeon: Serafina Mitchell, MD;  Location: [facility] OR;  Service: Vascular;  Laterality: Left;    Current Medications: Current Meds  Medication Sig  . acetaminophen (TYLENOL) 325 MG tablet Take by mouth.  Marland Kitchen amLODipine (NORVASC) 10 MG tablet Take 10 mg by mouth See admin instructions. Takes night before dialysis - Mon Wed and Fri night  . carvedilol (COREG) 3.125 MG tablet Take 3.125 mg by mouth daily. Takes night before dialysis - Mon Wed and Fri night only. All other days, take in morning  . cephALEXin (KEFLEX) 500 MG capsule Take 1 capsule (500 mg total) by mouth 4 (four) times daily.  . diphenhydrAMINE HCl, Sleep, (SLEEP AID, DIPHENHYDRAMINE, PO) Take by mouth.  . hydrALAZINE (APRESOLINE) 10 MG tablet Take 10 mg by mouth See admin instructions. Takes night before dialysis - Mon Wed and Fri night  . hydrOXYzine (ATARAX/VISTARIL) 10 MG tablet Take by mouth.  . iron sucrose in sodium chloride 0.9 % 100 mL Iron Sucrose (Venofer)  . LORazepam (ATIVAN) 1 MG tablet Take 1 tablet (1 mg total) by mouth 3 (three) times daily as needed for anxiety.  . meclizine (ANTIVERT) 25 MG tablet Take 25 mg by mouth 3 (three) times daily as needed for dizziness.  . Methoxy PEG-Epoetin Beta (MIRCERA IJ) Mircera  . omeprazole (PRILOSEC) 20 MG capsule Take 20 mg by mouth See admin instructions. Takes night before dialysis - Mon Wed and Fri night  . sodium zirconium cyclosilicate (LOKELMA) 10 g PACK packet Take by mouth.  . sucroferric oxyhydroxide (VELPHORO) 500 MG chewable tablet Chew 500 mg by mouth 3 (three) times daily with meals.   Marland Kitchen tiZANidine (ZANAFLEX) 4 MG tablet Take 4 mg by mouth every 6 (six) hours as needed for muscle spasms.  Marland Kitchen torsemide (DEMADEX) 100 MG tablet Take by mouth.  Marland Kitchen VITAMIN D PO Take by mouth.     Allergies:   Patient has no known allergies.   Social History   Socioeconomic History  . Marital  status: Single    Spouse name: Not on file  . Number of children: Not on file  . Years of education: Not on file  . Highest education level: Not on file  Occupational History  . Not on file  Tobacco Use  . Smoking status: Current Every Day Smoker  . Smokeless tobacco: Former Network engineer  . Vaping Use: Never used  Substance and Sexual Activity  . Alcohol use: Not Currently  . Drug use: Yes    Types: Marijuana    Comment: daily  . Sexual activity: Not on file  Other Topics Concern  . Not on file  Social History Narrative  . Not on file   Social Determinants of Health   Financial Resource Strain:   . Difficulty of Paying Living Expenses:   Food Insecurity:   . Worried About Charity fundraiser in the Last Year:   . [employer] of  Food in the Last Year:   Transportation Needs:   . Film/video editor (Medical):   Marland Kitchen Lack of Transportation (Non-Medical):   Physical Activity:   . Days of Exercise per Week:   . Minutes of Exercise per Session:   Stress:   . Feeling of Stress :   Social Connections:   . Frequency of Communication with Friends and Family:   . Frequency of Social Gatherings with Friends and Family:   . Attends Religious Services:   . Active Member of Clubs or Organizations:   . Attends Archivist Meetings:   Marland Kitchen Marital Status:      Family History: [name] family history is not on file.  ROS:   Please see the history of present illness.    All other systems reviewed and are negative.  EKGs/Labs/Other Studies Reviewed:    The following studies were reviewed today: EKG from the emergency room revealed sinus rhythm and nonspecific ST-T changes.   Recent Labs: 03/09/2020: B Natriuretic Peptide >4,500.0; BUN 25; Creatinine, Ser 6.34; Hemoglobin 10.8; Platelets 110; Potassium 4.3; Sodium 137  Recent Lipid Panel No results found for: CHOL, TRIG, HDL, CHOLHDL, VLDL, LDLCALC, LDLDIRECT  Physical Exam:    VS:  BP (!) 170/108   Pulse 84    Ht 5\' 11"  (1.803 m)   Wt 191 lb (86.6 kg)   SpO2 95%   BMI 26.64 kg/m     Wt Readings from Last 3 Encounters:  03/21/20 191 lb (86.6 kg)  03/09/20 190 lb (86.2 kg)  05/03/19 197 lb 6.4 oz (89.5 kg)     GEN: Patient is in no acute distress HEENT: Normal NECK: No JVD; No carotid bruits LYMPHATICS: No lymphadenopathy CARDIAC: S1 S2 regular, 2/6 systolic murmur at the apex. RESPIRATORY:  Clear to auscultation without rales, wheezing or rhonchi  ABDOMEN: Soft, non-tender, non-distended MUSCULOSKELETAL:  No edema; No deformity  SKIN: Warm and dry NEUROLOGIC:  Alert and oriented x 3 PSYCHIATRIC:  Normal affect    Signed, Jenean Lindau, MD  03/21/2020 9:10 AM    [place]

## 2020-03-21 NOTE — Addendum Note (Signed)
Addended by: Truddie Hidden on: 03/21/2020 09:40 AM   Modules accepted: Orders

## 2020-03-29 DIAGNOSIS — R197 Diarrhea, unspecified: Secondary | ICD-10-CM

## 2020-03-29 HISTORY — DX: Diarrhea, unspecified: R19.7

## 2020-04-13 DIAGNOSIS — R252 Cramp and spasm: Secondary | ICD-10-CM

## 2020-04-13 HISTORY — DX: Cramp and spasm: R25.2

## 2020-04-25 ENCOUNTER — Telehealth (HOSPITAL_COMMUNITY): Payer: Self-pay | Admitting: *Deleted

## 2020-04-25 NOTE — Telephone Encounter (Signed)
Left message on voicemail in reference to upcoming appointment scheduled for 05/02/20. Phone number given for a call back so details instructions can be given. Jacob Colon

## 2020-04-27 DIAGNOSIS — E440 Moderate protein-calorie malnutrition: Secondary | ICD-10-CM

## 2020-04-27 HISTORY — DX: Moderate protein-calorie malnutrition: E44.0

## 2020-05-09 ENCOUNTER — Encounter: Payer: Self-pay | Admitting: Cardiology

## 2020-05-09 ENCOUNTER — Other Ambulatory Visit: Payer: No Typology Code available for payment source

## 2020-05-30 ENCOUNTER — Other Ambulatory Visit: Payer: Medicaid Other

## 2020-06-13 IMAGING — RF LEFT ANG/EXT/UNI/ OR
1 series · 15 of 15 positions shown · IV contrast (agent unspecified)
Comparison: None.

CLINICAL DATA: 62-year-old male with renal failure and left upper
extremity dialysis circuit

EXAM:
FAWZIA HAZARD/EXT/UNI/ OR
CONTRAST:  Op note
FLUOROSCOPY TIME:  Fluoroscopy Time:  Op note

[Series 1: unknown protocol · 0.20mm/px · 4 acquisitions, 15 frames shown]
[im 1/4]
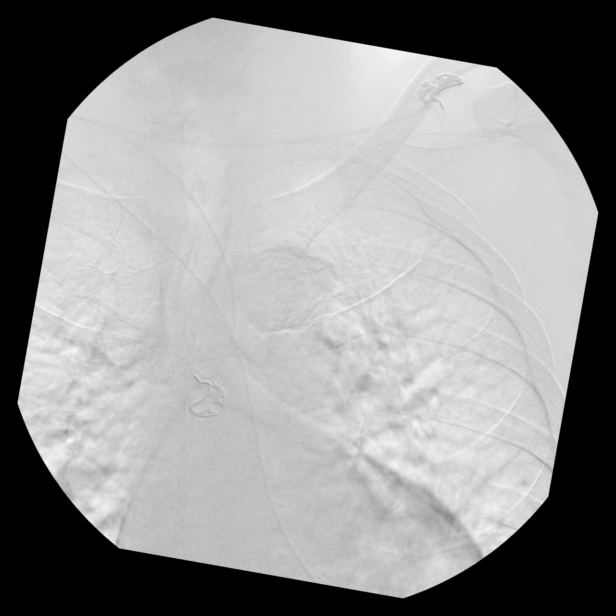
[im 1/4]
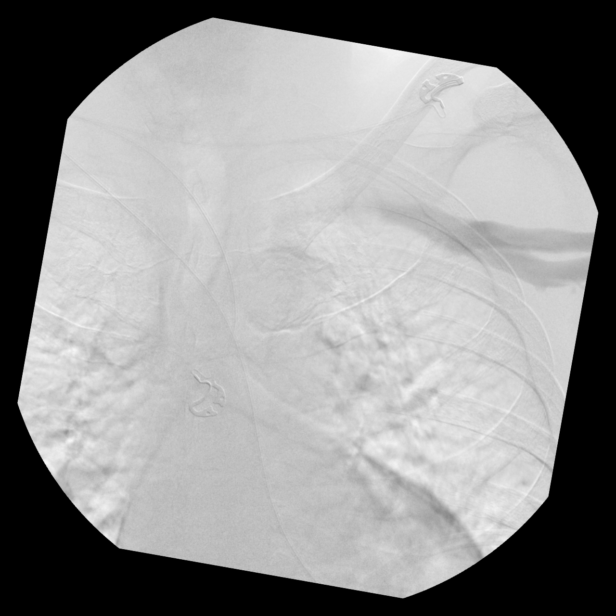
[im 1/4]
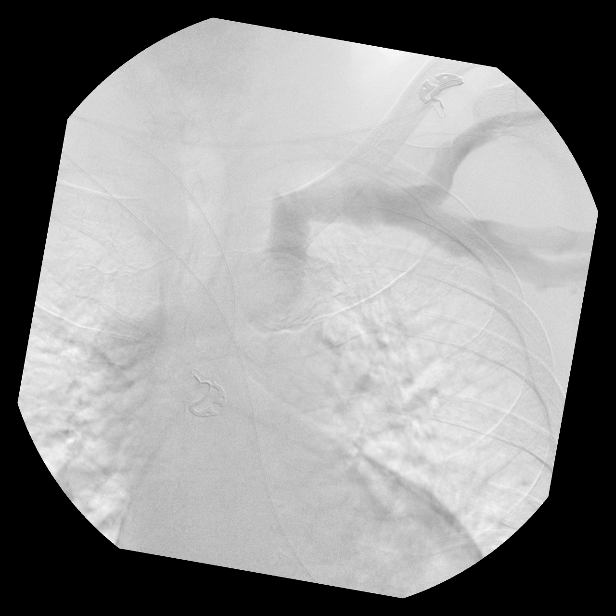
[im 1/4]
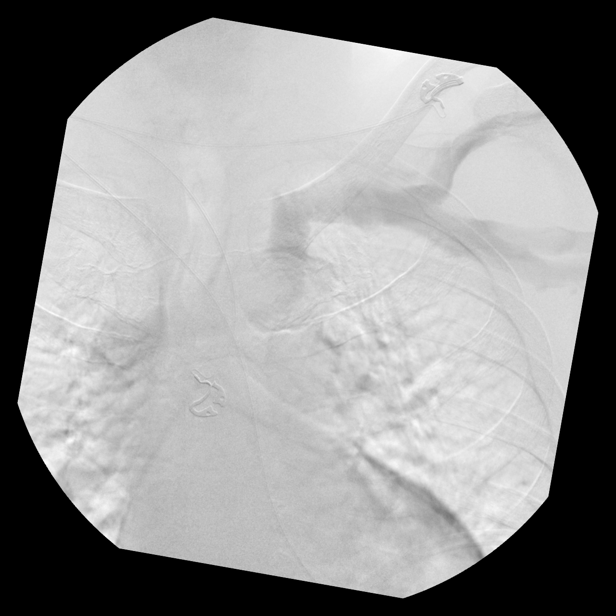
[im 2/4]
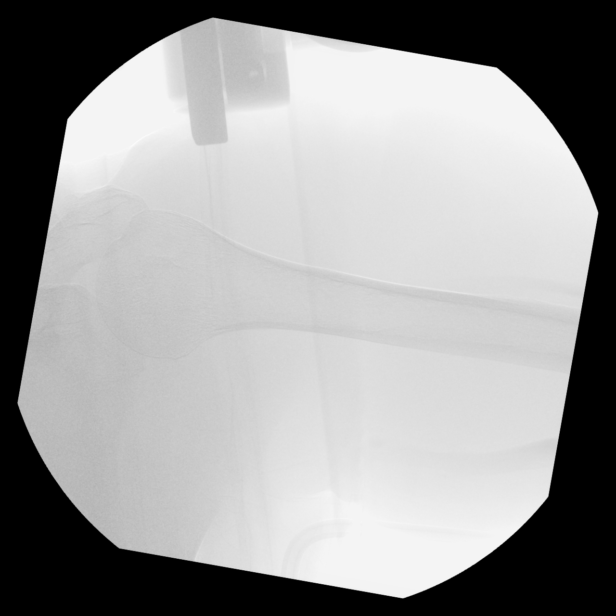
[im 2/4]
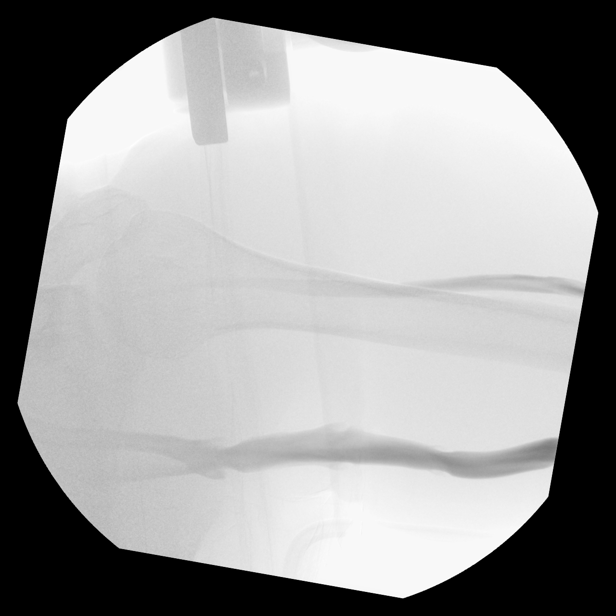
[im 2/4]
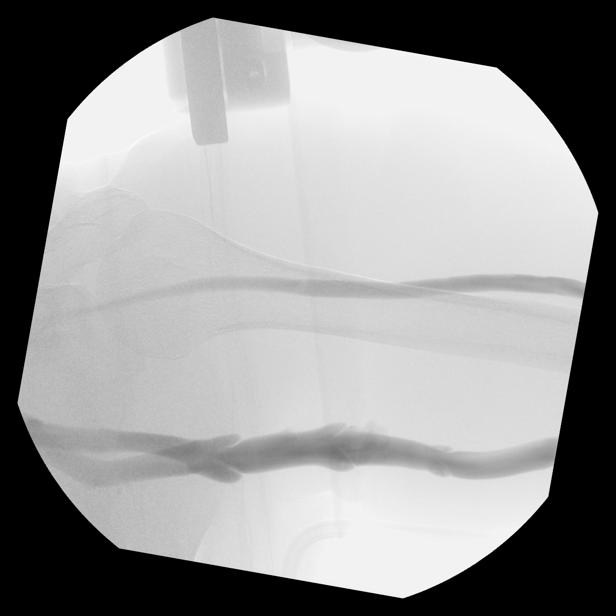
[im 2/4]
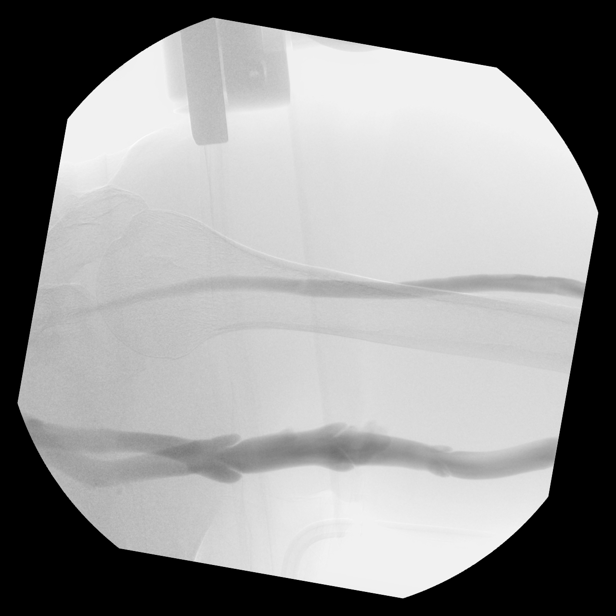
[im 3/4]
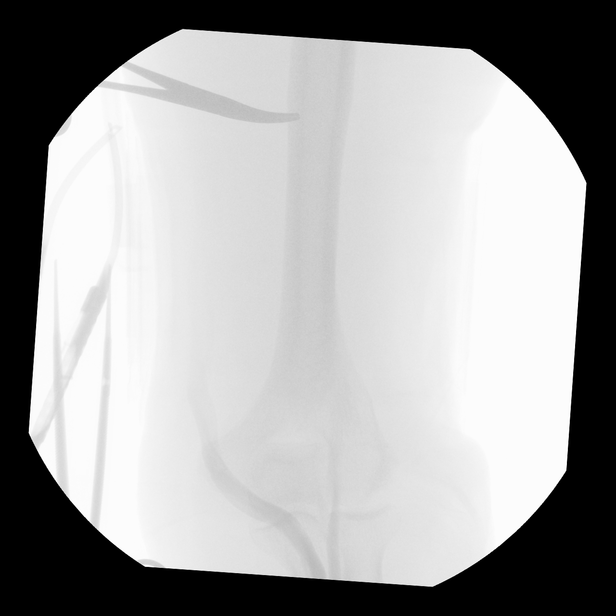
[im 3/4]
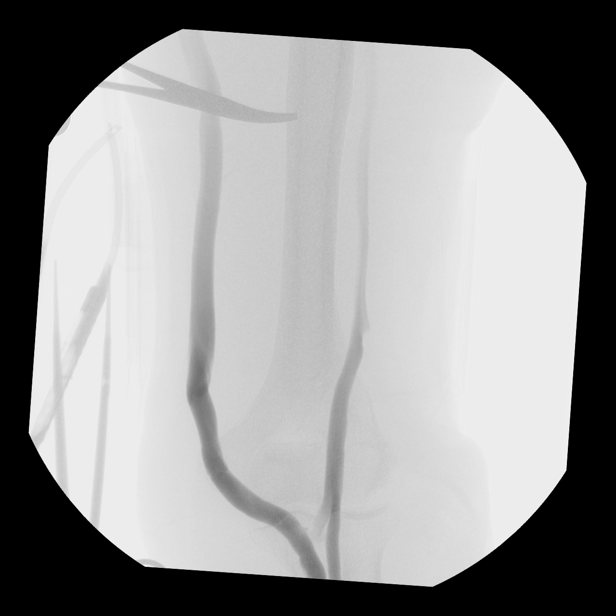
[im 3/4]
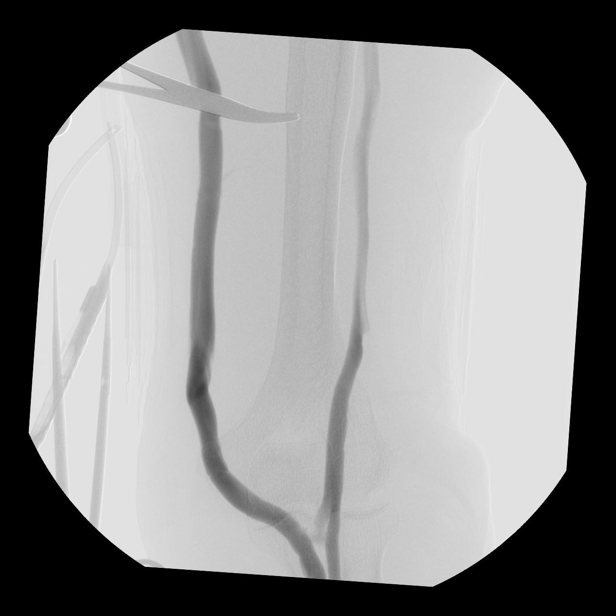
[im 3/4]
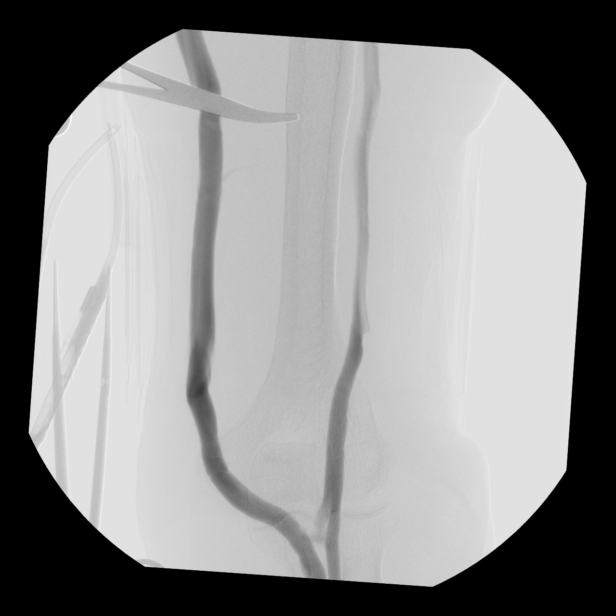
[im 4/4]
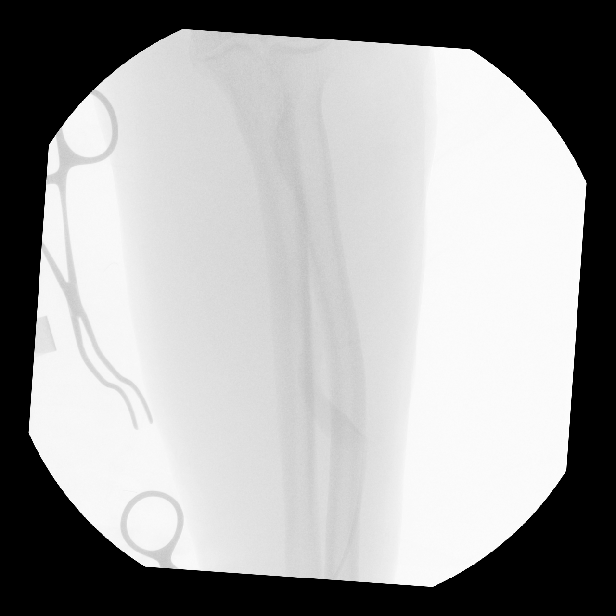
[im 4/4]
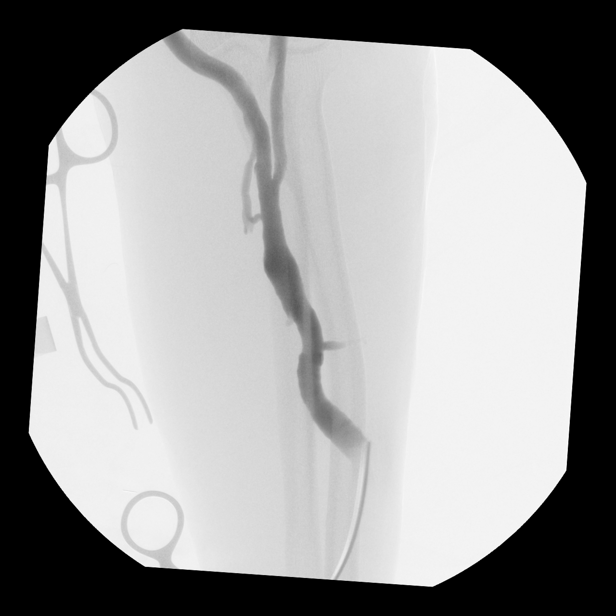
[im 4/4]
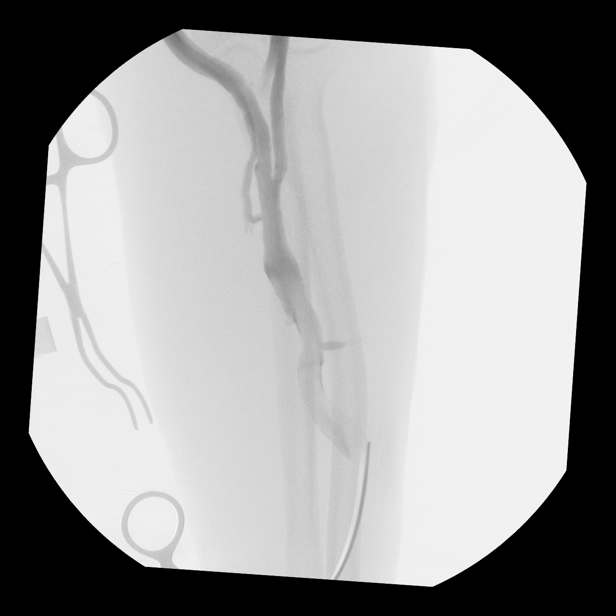

[15 of 15 positions shown; findings below may reference images not displayed]

FINDINGS: Limited intraoperative angiographic images presented. Images
demonstrate patent venous outflow of left upper extremity dialysis
circuit with no high-grade stenosis identified within the cephalic
vein or the brachial vein. No occlusion or high-grade stenosis of
the axillary vein, subclavian vein, brachiocephalic vein or SVC.
IMPRESSION: Images of left upper extremity fistulagram, as above. Please refer
to the dictated operative report for full details of intraoperative
findings and procedure.

## 2020-06-14 DIAGNOSIS — I499 Cardiac arrhythmia, unspecified: Secondary | ICD-10-CM | POA: Insufficient documentation

## 2020-06-14 DIAGNOSIS — G10 Huntington's disease: Secondary | ICD-10-CM | POA: Insufficient documentation

## 2020-06-14 DIAGNOSIS — G4730 Sleep apnea, unspecified: Secondary | ICD-10-CM | POA: Insufficient documentation

## 2020-06-14 DIAGNOSIS — I639 Cerebral infarction, unspecified: Secondary | ICD-10-CM | POA: Insufficient documentation

## 2020-06-14 DIAGNOSIS — I10 Essential (primary) hypertension: Secondary | ICD-10-CM | POA: Insufficient documentation

## 2020-06-15 ENCOUNTER — Ambulatory Visit: Payer: Medicaid Other | Admitting: Cardiology

## 2020-08-05 ENCOUNTER — Encounter (HOSPITAL_COMMUNITY): Payer: Self-pay | Admitting: *Deleted

## 2020-08-05 ENCOUNTER — Other Ambulatory Visit: Payer: Self-pay

## 2020-08-05 ENCOUNTER — Emergency Department (HOSPITAL_COMMUNITY)
Admission: EM | Admit: 2020-08-05 | Discharge: 2020-08-05 | Disposition: A | Discharge status: Home / Self Care | Payer: No Typology Code available for payment source | Attending: Emergency Medicine | Admitting: Emergency Medicine

## 2020-08-05 ENCOUNTER — Emergency Department (HOSPITAL_COMMUNITY): Payer: No Typology Code available for payment source

## 2020-08-05 DIAGNOSIS — L989 Disorder of the skin and subcutaneous tissue, unspecified: Secondary | ICD-10-CM

## 2020-08-05 DIAGNOSIS — L988 Other specified disorders of the skin and subcutaneous tissue: Secondary | ICD-10-CM | POA: Insufficient documentation

## 2020-08-05 DIAGNOSIS — F17200 Nicotine dependence, unspecified, uncomplicated: Secondary | ICD-10-CM | POA: Diagnosis not present

## 2020-08-05 DIAGNOSIS — I120 Hypertensive chronic kidney disease with stage 5 chronic kidney disease or end stage renal disease: Secondary | ICD-10-CM | POA: Diagnosis not present

## 2020-08-05 DIAGNOSIS — Z992 Dependence on renal dialysis: Secondary | ICD-10-CM | POA: Diagnosis not present

## 2020-08-05 DIAGNOSIS — N186 End stage renal disease: Secondary | ICD-10-CM | POA: Insufficient documentation

## 2020-08-05 DIAGNOSIS — R0602 Shortness of breath: Secondary | ICD-10-CM | POA: Insufficient documentation

## 2020-08-05 DIAGNOSIS — Z79899 Other long term (current) drug therapy: Secondary | ICD-10-CM | POA: Diagnosis not present

## 2020-08-05 DIAGNOSIS — R600 Localized edema: Secondary | ICD-10-CM | POA: Diagnosis not present

## 2020-08-05 LAB — CBC WITH DIFFERENTIAL/PLATELET
Abs Immature Granulocytes: 0.01 10*3/uL (ref 0.00–0.07)
Basophils Absolute: 0.1 10*3/uL (ref 0.0–0.1)
Basophils Relative: 2 %
Eosinophils Absolute: 0.2 10*3/uL (ref 0.0–0.5)
Eosinophils Relative: 3 %
HCT: 30.3 % — ABNORMAL LOW (ref 39.0–52.0)
Hemoglobin: 9.5 g/dL — ABNORMAL LOW (ref 13.0–17.0)
Immature Granulocytes: 0 %
Lymphocytes Relative: 18 %
Lymphs Abs: 0.8 10*3/uL (ref 0.7–4.0)
MCH: 34.1 pg — ABNORMAL HIGH (ref 26.0–34.0)
MCHC: 31.4 g/dL (ref 30.0–36.0)
MCV: 108.6 fL — ABNORMAL HIGH (ref 80.0–100.0)
Monocytes Absolute: 0.4 10*3/uL (ref 0.1–1.0)
Monocytes Relative: 8 %
Neutro Abs: 3.1 10*3/uL (ref 1.7–7.7)
Neutrophils Relative %: 69 %
Platelets: 116 10*3/uL — ABNORMAL LOW (ref 150–400)
RBC: 2.79 MIL/uL — ABNORMAL LOW (ref 4.22–5.81)
RDW: 16.2 % — ABNORMAL HIGH (ref 11.5–15.5)
WBC: 4.5 10*3/uL (ref 4.0–10.5)
nRBC: 0 % (ref 0.0–0.2)

## 2020-08-05 LAB — COMPREHENSIVE METABOLIC PANEL
ALT: 12 U/L (ref 0–44)
AST: 17 U/L (ref 15–41)
Albumin: 2.5 g/dL — ABNORMAL LOW (ref 3.5–5.0)
Alkaline Phosphatase: 141 U/L — ABNORMAL HIGH (ref 38–126)
Anion gap: 14 (ref 5–15)
BUN: 22 mg/dL (ref 8–23)
CO2: 28 mmol/L (ref 22–32)
Calcium: 9.1 mg/dL (ref 8.9–10.3)
Chloride: 94 mmol/L — ABNORMAL LOW (ref 98–111)
Creatinine, Ser: 5.28 mg/dL — ABNORMAL HIGH (ref 0.61–1.24)
GFR, Estimated: 12 mL/min — ABNORMAL LOW (ref >60)
Glucose, Bld: 92 mg/dL (ref 70–99)
Potassium: 3.9 mmol/L (ref 3.5–5.1)
Sodium: 136 mmol/L (ref 135–145)
Total Bilirubin: 1.3 mg/dL — ABNORMAL HIGH (ref 0.3–1.2)
Total Protein: 6.1 g/dL — ABNORMAL LOW (ref 6.5–8.1)

## 2020-08-05 MED ORDER — IVERMECTIN 3 MG PO TABS: 200.0000 ug/kg | ORAL_TABLET | Freq: Once | ORAL | 0 refills | Status: AC

## 2020-08-05 MED ORDER — HYDROXYZINE HCL 25 MG PO TABS: 25.0000 mg | ORAL_TABLET | Freq: Four times a day (QID) | ORAL | 0 refills | Status: AC

## 2020-08-05 NOTE — ED Notes (Signed)
Pt d/c home per MD order. No s/s of acute distress noted.

## 2020-08-05 NOTE — ED Notes (Signed)
Pt remains in the bathroom getting cleaned up. Pt able to perform ADLS independently; will continue to monitor.

## 2020-08-05 NOTE — ED Notes (Signed)
In triage 

## 2020-08-05 NOTE — ED Provider Notes (Addendum)
[facility] EMERGENCY DEPARTMENT Provider Note   CSN: [id number] Arrival date & time: 08/05/20  0703     History Chief Complaint  Patient presents with  . Shortness of Breath    Jacob Colon is a 64 y.o. male with history of ESRD on HD MWF in [place], hypertension, strokes, homelessness, anxiety presents to the ED for evaluation of skin lesions present for over a month.  Reports associated itching.  Lesions are located on dorsal hands, face.  States worms are coming out of these areas as well as coming out of his nose, mouth and in stool.  States he has had panic attacks and cannot sleep because of these issues.  Patient is a [place] patient and states they have not done any treatment for this there. [facility] his [facility] for this, has an appointment with PCP November 28th.  He wants medicine to get rid of these worms.  Patient is currently living in a [place].  Reports feeling short of breath with ambulation.  He had dialysis session yesterday but stopped 30 minutes early because his leg started to cramp.  He is compliant with dialysis weekly.  Reports chronic abdominal distention and leg swelling.  Has had 3 COVID vaccines. No fever, cough, chest pain.  Patient is asking if he can use the showers here.  Also states he wants to stay overnight so he can rest.  States he cannot rest in his [place] and because he keeps having panic attacks.  He has a hemodialysis Education officer, museum and states that she is trying to get him at house to live in.  Denies  IV drug use or injections on his hands.  Does smoke occasionally marijuana.  Denies alcohol use or tobacco use.  HPI     Past Medical History:  Diagnosis Date  . Allergy, unspecified, initial encounter 12/15/2019  . Anal fissure, unspecified 04/29/2018  . Anemia in chronic kidney disease 04/29/2018  . Anxiety disorder 04/29/2018  . Anxiety disorder, unspecified 04/29/2018  . Arteriovenous fistula, acquired ([place])  04/29/2018  . Breakdown (mechanical) of surgically created arteriovenous shunt, sequela 05/17/2019  . Cardiac murmur 03/21/2020  . Cellulitis of left lower limb 09/08/2018  . Cellulitis of left upper limb 09/08/2018  . Chest discomfort 03/21/2020  . Coagulation defect ([place]) 04/29/2018  . Coagulation defect, unspecified ([place]) 04/29/2018  . Cramp and spasm 04/13/2020  . Diarrhea, unspecified 03/29/2020  . Discitis, unspecified, site unspecified 04/29/2018  . Dysrhythmia    IRREGULAR HEARTRATE  . Encounter for immunization 05/27/2018  . Encounter for removal of sutures 08/18/2018  . Encounter for screening for respiratory tuberculosis 04/29/2018  . End stage renal disease ([place]) 04/29/2018  . ESRD (end stage renal disease) ([place]) 04/29/2018  . Essential hypertension 03/21/2020  . Fluid overload 07/13/2018  . Gastro-esophageal reflux disease without esophagitis 04/29/2018  . GERD without esophagitis 04/29/2018  . Huntington's disease ([place])   . Hyperkalemia 06/09/2018  . Hypertension   . Hypertensive chronic kidney disease with stage 1 through stage 4 chronic kidney disease, or unspecified chronic kidney disease 04/29/2018  . Hypokalemia 04/30/2018  . Iron deficiency anemia 05/27/2018  . Iron deficiency anemia, unspecified 05/27/2018  . Major depressive disorder, single episode, unspecified 04/29/2018  . Moderate protein-calorie malnutrition ([place]) 04/27/2020  . Mononeuropathy, unspecified 04/29/2018  . Osteoarthritis of knee, unspecified 04/29/2018  . Other fluid overload 07/13/2018  . Other mechanical complication of surgically created arteriovenous fistula, subsequent encounter 11/10/2018  .  Pain, unspecified 04/29/2018  . Personal history of other malignant neoplasm of large intestine 04/29/2018  . Polycystic kidney, unspecified 04/29/2018  . Pruritus, unspecified 04/29/2018  . Secondary hyperparathyroidism of renal origin ([place]) 06/04/2018  . Shortness of breath 01/06/2020  . Sleep apnea   .  Stroke [facility])    hx of 2 mini strokes - most recent was in 2020  . Vitamin D deficiency 04/29/2018  . Vitamin D deficiency, unspecified 04/29/2018    Patient Active Problem List   Diagnosis Date Noted  . Dysrhythmia   . Huntington's disease ([place])   . Hypertension   . Sleep apnea   . Stroke ([place])   . Moderate protein-calorie malnutrition ([place]) 04/27/2020  . Cramp and spasm 04/13/2020  . Diarrhea, unspecified 03/29/2020  . Chest discomfort 03/21/2020  . Essential hypertension 03/21/2020  . Cardiac murmur 03/21/2020  . Shortness of breath 01/06/2020  . Allergy, unspecified, initial encounter 12/15/2019  . Breakdown (mechanical) of surgically created arteriovenous shunt, sequela 05/17/2019  . Other mechanical complication of surgically created arteriovenous fistula, subsequent encounter 11/10/2018  . Cellulitis of left upper limb 09/08/2018  . Cellulitis of left lower limb 09/08/2018  . Encounter for removal of sutures 08/18/2018  . Other fluid overload 07/13/2018  . Fluid overload 07/13/2018  . Hyperkalemia 06/09/2018  . Secondary hyperparathyroidism of renal origin ([place]) 06/04/2018  . Iron deficiency anemia, unspecified 05/27/2018  . Encounter for immunization 05/27/2018  . Iron deficiency anemia 05/27/2018  . Hypokalemia 04/30/2018  . End stage renal disease ([place]) 04/29/2018  . Anal fissure, unspecified 04/29/2018  . Anemia in chronic kidney disease 04/29/2018  . Anxiety disorder, unspecified 04/29/2018  . Arteriovenous fistula, acquired ([place]) 04/29/2018  . Coagulation defect, unspecified ([place]) 04/29/2018  . Discitis, unspecified, site unspecified 04/29/2018  . ESRD (end stage renal disease) ([place]) 04/29/2018  . Gastro-esophageal reflux disease without esophagitis 04/29/2018  . Hypertensive chronic kidney disease with stage 1 through stage 4 chronic kidney disease, or unspecified chronic kidney disease 04/29/2018  . Major depressive disorder, single episode, unspecified  04/29/2018  . Pain, unspecified 04/29/2018  . Polycystic kidney, unspecified 04/29/2018  . Pruritus, unspecified 04/29/2018  . Vitamin D deficiency, unspecified 04/29/2018  . Encounter for screening for respiratory tuberculosis 04/29/2018  . Mononeuropathy, unspecified 04/29/2018  . Osteoarthritis of knee, unspecified 04/29/2018  . Personal history of other malignant neoplasm of large intestine 04/29/2018  . Anxiety disorder 04/29/2018  . Coagulation defect ([place]) 04/29/2018  . GERD without esophagitis 04/29/2018  . Vitamin D deficiency 04/29/2018    Past Surgical History:  Procedure Laterality Date  . ANAL FISSURE REPAIR    . AV FISTULA PLACEMENT    . COLONOSCOPY    . FISTULOGRAM Left 04/07/2019   Procedure: FISTULOGRAM  WITH INTERVENTION OF ARTERIOVENOUS FISTULA LEFT ARM;  Surgeon: Serafina Mitchell, MD;  Location: [facility];  Service: Vascular;  Laterality: Left;  . REVISION OF ARTERIOVENOUS GORETEX GRAFT Left 04/07/2019   Procedure: BANDING AND REPAIR OF PSEUDOANEURYSM ARTERIOVENOUS FISTULA LEFT ARM;  Surgeon: Serafina Mitchell, MD;  Location: [facility];  Service: Vascular;  Laterality: Left;       No family history on file.  Social History   Tobacco Use  . Smoking status: Current Every Day Smoker  . Smokeless tobacco: Former Network engineer  . Vaping Use: Never used  Substance Use Topics  . Alcohol use: Not Currently  . Drug use: Yes    Types: Marijuana    Comment: daily    Home  Medications Prior to Admission medications   Medication Sig Start Date End Date Taking? Authorizing Provider  amLODipine (NORVASC) 10 MG tablet Take 10 mg by mouth See admin instructions. Takes night before dialysis - Mon Wed and Fri night    [provider]  carvedilol (COREG) 3.125 MG tablet Take 3.125 mg by mouth daily. Takes night before dialysis - Mon Wed and Fri night only. All other days, take in morning    [provider]  cephALEXin (KEFLEX) 500 MG capsule Take 1 capsule (500  mg total) by mouth 4 (four) times daily. 03/09/20   Mesner, Corene Cornea, MD  diphenhydrAMINE HCl, Sleep, (SLEEP AID, DIPHENHYDRAMINE, PO) Take by mouth. 05/24/19   [provider]  hydrALAZINE (APRESOLINE) 10 MG tablet Take 10 mg by mouth See admin instructions. Takes night before dialysis - Mon Wed and Fri night    [provider]  hydrOXYzine (ATARAX/VISTARIL) 25 MG tablet Take 1 tablet (25 mg total) by mouth every 6 (six) hours. 08/05/20   Kinnie Feil, PA-C  iron sucrose in sodium chloride 0.9 % 100 mL Iron Sucrose (Venofer) 12/01/19 11/29/20  [provider]  ivermectin (STROMECTOL) 3 MG TABS tablet Take 5.5 tablets (16,500 mcg total) by mouth once for 1 dose. 08/05/20 08/05/20  Kinnie Feil, PA-C  LORazepam (ATIVAN) 1 MG tablet Take 1 tablet (1 mg total) by mouth 3 (three) times daily as needed for anxiety. 03/09/20   Mesner, Corene Cornea, MD  meclizine (ANTIVERT) 25 MG tablet Take 25 mg by mouth 3 (three) times daily as needed for dizziness.    [provider]  Methoxy PEG-Epoetin Beta (MIRCERA IJ) Mircera 03/08/20 03/07/21  [provider]  nitroGLYCERIN (NITROSTAT) 0.4 MG SL tablet Place 1 tablet (0.4 mg total) under the tongue every 5 (five) minutes as needed for chest pain. 03/21/20 06/19/20  Revankar, Reita Cliche, MD  omeprazole (PRILOSEC) 20 MG capsule Take 20 mg by mouth See admin instructions. Takes night before dialysis - Mon Wed and Fri night    [provider]  sodium zirconium cyclosilicate (LOKELMA) 10 g PACK packet Take by mouth. 09/14/19   [provider]  sucroferric oxyhydroxide (VELPHORO) 500 MG chewable tablet Chew 500 mg by mouth 3 (three) times daily with meals.     [provider]  tiZANidine (ZANAFLEX) 4 MG tablet Take 4 mg by mouth every 6 (six) hours as needed for muscle spasms.    [provider]  torsemide (DEMADEX) 100 MG tablet Take by mouth. 12/27/19   [provider]  VITAMIN D PO Take by mouth.  02/02/20 01/31/21  [provider]    Allergies    Patient has no known allergies.  Review of Systems   Review of Systems  Respiratory: Positive for shortness of breath.   Skin: Positive for wound (lesions).  All other systems reviewed and are negative.   Physical Exam Updated Vital Signs BP (!) 153/98   Pulse (!) 102   Temp 98.5 F (36.9 C) (Oral)   Resp 18   Ht 5\' 10"  (1.778 m)   Wt 86.2 kg   SpO2 95%   BMI 27.26 kg/m   Physical Exam Vitals and nursing note reviewed.  Constitutional:      General: He is not in acute distress.    Appearance: He is well-developed.     Comments: Found asleep but easily arousable.  HENT:     Head: Normocephalic and atraumatic.     Right Ear: External ear normal.  Left Ear: External ear normal.     Nose: Nose normal.  Eyes:     General: No scleral icterus.    Conjunctiva/sclera: Conjunctivae normal.  Cardiovascular:     Rate and Rhythm: Normal rate and regular rhythm.     Heart sounds: Normal heart sounds. No murmur heard.      Comments: 2+ pitting edema around the ankles and mid tib/fibs bilaterally.  No calf tenderness.  Chronic appearing dry and hyperpigmented skin in the feet/legs. Pulmonary:     Effort: Pulmonary effort is normal.     Breath sounds: Normal breath sounds.     Comments: SPO2 greater than 94% on room air.  Normal work of breathing.  Speaking in full sentences.  Lungs clear, no crackles or rhonchi.  Slightly diminished in lower lobes. Abdominal:     Comments: Moderate abdominal distention, some pitting edema lateral/posterior abdomen.  Nontender.  No erythema, warmth.  Musculoskeletal:        General: No deformity. Normal range of motion.     Cervical back: Normal range of motion and neck supple.  Skin:    General: Skin is warm and dry.     Capillary Refill: Capillary refill takes less than 2 seconds.     Comments: See photo.  Excoriations in different stages of healing noted dorsal hands bilaterally.   Similar lesions noted around the naris and external ears.  No tenderness, erythema, warmth, discharge.  Neurological:     Mental Status: He is alert and oriented to person, place, and time.  Psychiatric:        Behavior: Behavior normal.        Thought Content: Thought content normal.        Judgment: Judgment normal.       ED Results / Procedures / Treatments   Labs (all labs ordered are listed, but only abnormal results are displayed) Labs Reviewed  CBC WITH DIFFERENTIAL/PLATELET - Abnormal; Notable for the following components:      Result Value   RBC 2.79 (*)    Hemoglobin 9.5 (*)    HCT 30.3 (*)    MCV 108.6 (*)    MCH 34.1 (*)    RDW 16.2 (*)    Platelets 116 (*)    All other components within normal limits  COMPREHENSIVE METABOLIC PANEL - Abnormal; Notable for the following components:   Chloride 94 (*)    Creatinine, Ser 5.28 (*)    Total Protein 6.1 (*)    Albumin 2.5 (*)    Alkaline Phosphatase 141 (*)    Total Bilirubin 1.3 (*)    GFR, Estimated 12 (*)    All other components within normal limits    EKG None  Radiology DG Chest Portable 1 View  Result Date: 08/05/2020 CLINICAL DATA:  64 year old male with history of shortness of breath. Hemodialysis patient. EXAM: PORTABLE CHEST 1 VIEW COMPARISON:  Chest x-ray 07/09/2020. FINDINGS: Lung volumes are slightly low. There is cephalization of the pulmonary vasculature and slight indistinctness of the interstitial markings suggestive of mild pulmonary edema. Small bilateral pleural effusions (right greater than left). Mild cardiomegaly. No pneumothorax. Upper mediastinal contours are within normal limits. Aortic atherosclerosis. IMPRESSION: 1. The appearance of the chest suggests congestive heart failure, as above. 2. Aortic atherosclerosis. Electronically Signed   By: Vinnie Langton M.D.   On: 08/05/2020 09:58    Procedures Procedures (including critical care time)  Medications Ordered in ED Medications - No  data to display  ED Course  I have reviewed the triage vital signs and the nursing notes.  Pertinent labs & imaging results that were available during my care of the patient were reviewed by me and considered in my medical decision making (see chart for details).  Clinical Course as of Aug 05 1054  Sat Aug 05, 2020  3888 Per EMT "[facility] patient to the shower with pulse oxy patient oxygen level went down to 96 room air patient did fine "   [CG]  1004 Lung volumes are slightly low. There is cephalization of the pulmonary vasculature and slight indistinctness of the interstitial markings suggestive of mild pulmonary edema. Small bilateral pleural effusions (right greater than left). Mild cardiomegaly. No pneumothorax. Upper mediastinal contours are within normal limits. Aortic atherosclerosis.  IMPRESSION: 1. The appearance of the chest suggests congestive heart failure, as above. 2. Aortic atherosclerosis.  DG Chest Portable 1 View [CG]  1016 EMR, triage nursing notes reviewed to assist with history and MDM.  Seen in August 2020 in ED for shortness of breath for months.  At that time he had some very mild vascular congestion/CHF on his chest x-ray, elevated BNP in setting of ESRD and HD.  Was discharged with cardiology follow-up.  Saw Dr Geraldo Pitter with cardiology 02/2020 who suspected CP/SOB sounded atypical, ordered stress test and echocardiogram due to several risk factors but patient did not show up to appointments.  He is a [place] patient and I do not have any more records available.   [CG]  1026 Hemoglobin(!): 9.5 [CG]  1026 HCT(!): 30.3 [CG]    Clinical Course User Index [CG] Kinnie Feil, PA-C   MDM Rules/Calculators/A&P                          64 year old male with history of ESRD on HD MWF presents to the ED for shortness of breath and also skin lesions for several months.  He is homeless.  He is very upfront and states that he would like to shower here and would like to spend  the night to get some rest.  Tells me he has a Education officer, museum that is trying to find him a home.  Last dialysis yesterday short by 30 minutes.  No chest pain.  See above for review EMR, cardiology evaluation 02/2020.  Ddx: CHF hypervolemia vs renal hypervolemia vs deconditioning. No fever, cough infectious process like PNA less likely. Fully vaccinated with booster for COVID. No CP, hemoptysis, h/o DVT/PE, doubt PE.  Considered ACS, PE unlikely given chronicity of shortness of breath, no CP.   Lab work ordered: CBC and CMP.  Patient has no chest pain and reports chronic shortness of breath.  BNP not ordered, patient is a dialysis patient. Troponin not ordered given lack of CP, chronic symptoms. Suspicion is low for ACS.  Imaging ordered: Chest x-ray and EKG.  Continuous cardiac and pulse oximeter.  Will ambulate with pulse ox here.  1015: ER work-up personally visualized and interpreted.  Chest x-ray with some mild pulmonary edema small bilateral pleural effusions.  Compared to his last chest x-ray in our system this is not significantly different.    Lab works are reassuring.  His creatinine is better than his usual baseline.  No significant electrolyte of normalities.  No leukocytosis.  Stable hemoglobin.  Patient reevaluated no clinical decline.  Has ambulated several times to use bathroom without hypoxia. At this time patient is deemed appropriate for discharge. Some clinical evidence of mild hypervolemia but  not severe, no hypoxia, stable CXR.  No respiratory distress.  He ambulated and took a shower here in the ED with SPO2 greater than 95%.  He does have some leg edema but no rales, hypoxia.  No cardiac work up on EMR.  Hypervolemia may be related to needing more dialysis vs mild CHF, or both.  Not a candidate for lasix, and would need dialysis for this. Messaged renal SW Terri Piedra to facilitate HD this weekend. Encouraged patient to follow up with cardiology.  He wants a medicine to get  rid of his lesions and worms. Will give ivermectin, hydroxyzine. Has PCP appointment 11/28.  Discussed with EDP.   Final Clinical Impression(s) / ED Diagnoses Final diagnoses:  Skin lesion  Chronic shortness of breath  Leg edema    Rx / DC Orders ED Discharge Orders         Ordered    ivermectin (STROMECTOL) 3 MG TABS tablet   Once        08/05/20 1054    hydrOXYzine (ATARAX/VISTARIL) 25 MG tablet  Every 6 hours        08/05/20 1054             Arlean Hopping 08/05/20 1055    Blanchie Dessert, MD 08/06/20 [phone]

## 2020-08-05 NOTE — Discharge Instructions (Addendum)
You were seen in the ED for skin lesions and shortness of breath  The cause of your skin lesions and itching is unclear.  We will give you 1 dose of ivermectin which is a medicine that can treat pinworms and other types of parasites.  This is a one-time dose.  This may or may not help symptoms.  Please go to your PCP appointment on November 28 for further discussion of your symptoms.  You may need referral to a specialist like dermatology.  You have some extra fluid in your body but no need for admission or emergent dialysis.  Please call your dialysis center today and try and get in to do an extra session over the weekend.  I have messaged our renal social worker Jaclyn Shaggy to help facilitate this.  Return to the ED for worsening leg swelling, shortness of breath, chest pain  Call cardiology and make an appointment.  They wanted to do a stress test and an echocardiogram in June of this year but you were unable to do it.  Make an appointment for reevaluation and to get these tests done.

## 2020-08-05 NOTE — ED Notes (Signed)
Walked patient to the shower with pulse oxy patient oxygen level went down to 96 room air patient did fine

## 2020-08-05 NOTE — ED Triage Notes (Signed)
Patient presents to ed states he drove himself to ED c/o sob, states he had to come off dialysis 30 mins early yest because of cramping. Patient states he has untreated scabies , states he is homeless and his social worker states he needed to come to the ED.

## 2021-03-16 ENCOUNTER — Other Ambulatory Visit: Payer: Self-pay | Admitting: Cardiology

## 2021-03-16 DIAGNOSIS — R011 Cardiac murmur, unspecified: Secondary | ICD-10-CM

## 2021-03-16 DIAGNOSIS — R0789 Other chest pain: Secondary | ICD-10-CM

## 2021-05-31 DEATH — deceased
